# Patient Record
Sex: Female | Born: 1935 | Race: White | Marital: Married | Smoking: Former smoker
Health system: Southern US, Community
[De-identification: ages and names within clinical notes are randomized; demographics above are authoritative.]

## PROBLEM LIST (undated history)

## (undated) DIAGNOSIS — Z741 Need for assistance with personal care: Secondary | ICD-10-CM

## (undated) DIAGNOSIS — I499 Cardiac arrhythmia, unspecified: Secondary | ICD-10-CM

## (undated) DIAGNOSIS — L89152 Pressure ulcer of sacral region, stage 2: Secondary | ICD-10-CM

## (undated) DIAGNOSIS — G4700 Insomnia, unspecified: Secondary | ICD-10-CM

## (undated) DIAGNOSIS — S82402D Unspecified fracture of shaft of left fibula, subsequent encounter for closed fracture with routine healing: Secondary | ICD-10-CM

## (undated) DIAGNOSIS — I25118 Atherosclerotic heart disease of native coronary artery with other forms of angina pectoris: Secondary | ICD-10-CM

## (undated) DIAGNOSIS — E039 Hypothyroidism, unspecified: Secondary | ICD-10-CM

## (undated) DIAGNOSIS — D649 Anemia, unspecified: Secondary | ICD-10-CM

## (undated) DIAGNOSIS — E1122 Type 2 diabetes mellitus with diabetic chronic kidney disease: Secondary | ICD-10-CM

## (undated) DIAGNOSIS — Z9181 History of falling: Secondary | ICD-10-CM

## (undated) DIAGNOSIS — I130 Hypertensive heart and chronic kidney disease with heart failure and stage 1 through stage 4 chronic kidney disease, or unspecified chronic kidney disease: Secondary | ICD-10-CM

## (undated) DIAGNOSIS — I10 Essential (primary) hypertension: Secondary | ICD-10-CM

## (undated) DIAGNOSIS — M6281 Muscle weakness (generalized): Secondary | ICD-10-CM

## (undated) DIAGNOSIS — E559 Vitamin D deficiency, unspecified: Secondary | ICD-10-CM

## (undated) DIAGNOSIS — R278 Other lack of coordination: Secondary | ICD-10-CM

## (undated) DIAGNOSIS — J309 Allergic rhinitis, unspecified: Secondary | ICD-10-CM

## (undated) DIAGNOSIS — I482 Chronic atrial fibrillation, unspecified: Secondary | ICD-10-CM

## (undated) HISTORY — DX: Type 2 diabetes mellitus with diabetic chronic kidney disease: E11.22

## (undated) HISTORY — DX: Anemia, unspecified: D64.9

## (undated) HISTORY — DX: Unspecified fracture of shaft of left fibula, subsequent encounter for closed fracture with routine healing: S82.402D

## (undated) HISTORY — DX: Muscle weakness (generalized): M62.81

## (undated) HISTORY — DX: Chronic atrial fibrillation, unspecified: I48.20

## (undated) HISTORY — DX: Hypertensive heart and chronic kidney disease with heart failure and stage 1 through stage 4 chronic kidney disease, or unspecified chronic kidney disease: I13.0

## (undated) HISTORY — PX: APPENDECTOMY: SHX54

## (undated) HISTORY — DX: Other lack of coordination: R27.8

## (undated) HISTORY — DX: History of falling: Z91.81

## (undated) HISTORY — DX: Need for assistance with personal care: Z74.1

## (undated) HISTORY — DX: Insomnia, unspecified: G47.00

## (undated) HISTORY — DX: Vitamin D deficiency, unspecified: E55.9

## (undated) HISTORY — DX: Pressure ulcer of sacral region, stage 2: L89.152

## (undated) HISTORY — DX: Atherosclerotic heart disease of native coronary artery with other forms of angina pectoris: I25.118

## (undated) HISTORY — DX: Allergic rhinitis, unspecified: J30.9

---

## 1999-10-14 DIAGNOSIS — E039 Hypothyroidism, unspecified: Secondary | ICD-10-CM | POA: Diagnosis present

## 1999-10-14 DIAGNOSIS — M109 Gout, unspecified: Secondary | ICD-10-CM | POA: Diagnosis present

## 1999-10-14 DIAGNOSIS — I499 Cardiac arrhythmia, unspecified: Secondary | ICD-10-CM | POA: Insufficient documentation

## 2020-04-25 DIAGNOSIS — C541 Malignant neoplasm of endometrium: Secondary | ICD-10-CM | POA: Insufficient documentation

## 2021-10-10 ENCOUNTER — Other Ambulatory Visit: Payer: Self-pay

## 2021-10-10 ENCOUNTER — Ambulatory Visit (INDEPENDENT_AMBULATORY_CARE_PROVIDER_SITE_OTHER): Payer: Medicare Other | Admitting: Podiatry

## 2021-10-10 ENCOUNTER — Ambulatory Visit (INDEPENDENT_AMBULATORY_CARE_PROVIDER_SITE_OTHER): Payer: Medicare Other

## 2021-10-10 ENCOUNTER — Inpatient Hospital Stay: Payer: Medicare Other

## 2021-10-10 ENCOUNTER — Emergency Department: Payer: Medicare Other

## 2021-10-10 ENCOUNTER — Telehealth: Payer: Self-pay | Admitting: Podiatry

## 2021-10-10 ENCOUNTER — Inpatient Hospital Stay
Admission: EM | Admit: 2021-10-10 | Discharge: 2021-10-16 | DRG: 540 | Disposition: A | Discharge status: Home Health | Payer: Medicare Other | Attending: Internal Medicine | Admitting: Internal Medicine

## 2021-10-10 DIAGNOSIS — Z833 Family history of diabetes mellitus: Secondary | ICD-10-CM | POA: Diagnosis not present

## 2021-10-10 DIAGNOSIS — Z923 Personal history of irradiation: Secondary | ICD-10-CM | POA: Diagnosis not present

## 2021-10-10 DIAGNOSIS — R7303 Prediabetes: Secondary | ICD-10-CM | POA: Diagnosis present

## 2021-10-10 DIAGNOSIS — E876 Hypokalemia: Secondary | ICD-10-CM | POA: Diagnosis present

## 2021-10-10 DIAGNOSIS — B9561 Methicillin susceptible Staphylococcus aureus infection as the cause of diseases classified elsewhere: Secondary | ICD-10-CM | POA: Diagnosis present

## 2021-10-10 DIAGNOSIS — I480 Paroxysmal atrial fibrillation: Secondary | ICD-10-CM | POA: Diagnosis present

## 2021-10-10 DIAGNOSIS — L03115 Cellulitis of right lower limb: Secondary | ICD-10-CM

## 2021-10-10 DIAGNOSIS — Z20822 Contact with and (suspected) exposure to covid-19: Secondary | ICD-10-CM | POA: Diagnosis present

## 2021-10-10 DIAGNOSIS — M10471 Other secondary gout, right ankle and foot: Secondary | ICD-10-CM | POA: Diagnosis not present

## 2021-10-10 DIAGNOSIS — N1831 Chronic kidney disease, stage 3a: Secondary | ICD-10-CM | POA: Diagnosis present

## 2021-10-10 DIAGNOSIS — Z66 Do not resuscitate: Secondary | ICD-10-CM | POA: Diagnosis present

## 2021-10-10 DIAGNOSIS — R7881 Bacteremia: Secondary | ICD-10-CM | POA: Diagnosis present

## 2021-10-10 DIAGNOSIS — M109 Gout, unspecified: Secondary | ICD-10-CM | POA: Diagnosis present

## 2021-10-10 DIAGNOSIS — Z87891 Personal history of nicotine dependence: Secondary | ICD-10-CM

## 2021-10-10 DIAGNOSIS — C541 Malignant neoplasm of endometrium: Secondary | ICD-10-CM | POA: Diagnosis present

## 2021-10-10 DIAGNOSIS — D649 Anemia, unspecified: Secondary | ICD-10-CM | POA: Diagnosis present

## 2021-10-10 DIAGNOSIS — Z79890 Hormone replacement therapy (postmenopausal): Secondary | ICD-10-CM

## 2021-10-10 DIAGNOSIS — L02611 Cutaneous abscess of right foot: Secondary | ICD-10-CM | POA: Diagnosis not present

## 2021-10-10 DIAGNOSIS — M7989 Other specified soft tissue disorders: Secondary | ICD-10-CM | POA: Diagnosis not present

## 2021-10-10 DIAGNOSIS — E039 Hypothyroidism, unspecified: Secondary | ICD-10-CM | POA: Diagnosis present

## 2021-10-10 DIAGNOSIS — I130 Hypertensive heart and chronic kidney disease with heart failure and stage 1 through stage 4 chronic kidney disease, or unspecified chronic kidney disease: Secondary | ICD-10-CM | POA: Diagnosis present

## 2021-10-10 DIAGNOSIS — Z79899 Other long term (current) drug therapy: Secondary | ICD-10-CM | POA: Diagnosis not present

## 2021-10-10 DIAGNOSIS — Z8542 Personal history of malignant neoplasm of other parts of uterus: Secondary | ICD-10-CM

## 2021-10-10 DIAGNOSIS — I10 Essential (primary) hypertension: Secondary | ICD-10-CM | POA: Diagnosis present

## 2021-10-10 DIAGNOSIS — R739 Hyperglycemia, unspecified: Secondary | ICD-10-CM | POA: Diagnosis present

## 2021-10-10 DIAGNOSIS — M009 Pyogenic arthritis, unspecified: Secondary | ICD-10-CM | POA: Diagnosis present

## 2021-10-10 DIAGNOSIS — I5032 Chronic diastolic (congestive) heart failure: Secondary | ICD-10-CM | POA: Diagnosis present

## 2021-10-10 DIAGNOSIS — M869 Osteomyelitis, unspecified: Principal | ICD-10-CM | POA: Diagnosis present

## 2021-10-10 DIAGNOSIS — R52 Pain, unspecified: Secondary | ICD-10-CM

## 2021-10-10 HISTORY — DX: Hypothyroidism, unspecified: E03.9

## 2021-10-10 HISTORY — DX: Cardiac arrhythmia, unspecified: I49.9

## 2021-10-10 HISTORY — DX: Essential (primary) hypertension: I10

## 2021-10-10 LAB — COMPREHENSIVE METABOLIC PANEL
ALT: 10 U/L (ref 0–44)
AST: 20 U/L (ref 15–41)
Albumin: 3.1 g/dL — ABNORMAL LOW (ref 3.5–5.0)
Alkaline Phosphatase: 168 U/L — ABNORMAL HIGH (ref 38–126)
Anion gap: 11 (ref 5–15)
BUN: 28 mg/dL — ABNORMAL HIGH (ref 8–23)
CO2: 27 mmol/L (ref 22–32)
Calcium: 8.8 mg/dL — ABNORMAL LOW (ref 8.9–10.3)
Chloride: 98 mmol/L (ref 98–111)
Creatinine, Ser: 1.12 mg/dL — ABNORMAL HIGH (ref 0.44–1.00)
GFR, Estimated: 47 mL/min — ABNORMAL LOW (ref >60)
Glucose, Bld: 218 mg/dL — ABNORMAL HIGH (ref 70–99)
Potassium: 3.5 mmol/L (ref 3.5–5.1)
Sodium: 136 mmol/L (ref 135–145)
Total Bilirubin: 0.9 mg/dL (ref 0.3–1.2)
Total Protein: 7.9 g/dL (ref 6.5–8.1)

## 2021-10-10 LAB — CBC WITH DIFFERENTIAL/PLATELET
Abs Immature Granulocytes: 0.04 10*3/uL (ref 0.00–0.07)
Basophils Absolute: 0 10*3/uL (ref 0.0–0.1)
Basophils Relative: 0 %
Eosinophils Absolute: 0.1 10*3/uL (ref 0.0–0.5)
Eosinophils Relative: 1 %
HCT: 37.6 % (ref 36.0–46.0)
Hemoglobin: 12 g/dL (ref 12.0–15.0)
Immature Granulocytes: 0 %
Lymphocytes Relative: 6 %
Lymphs Abs: 0.7 10*3/uL (ref 0.7–4.0)
MCH: 29.5 pg (ref 26.0–34.0)
MCHC: 31.9 g/dL (ref 30.0–36.0)
MCV: 92.4 fL (ref 80.0–100.0)
Monocytes Absolute: 0.7 10*3/uL (ref 0.1–1.0)
Monocytes Relative: 6 %
Neutro Abs: 9.2 10*3/uL — ABNORMAL HIGH (ref 1.7–7.7)
Neutrophils Relative %: 87 %
Platelets: 253 10*3/uL (ref 150–400)
RBC: 4.07 MIL/uL (ref 3.87–5.11)
RDW: 15.1 % (ref 11.5–15.5)
WBC: 10.8 10*3/uL — ABNORMAL HIGH (ref 4.0–10.5)
nRBC: 0 % (ref 0.0–0.2)

## 2021-10-10 LAB — LACTIC ACID, PLASMA
Lactic Acid, Venous: 2.7 mmol/L (ref 0.5–1.9)
Lactic Acid, Venous: 1.6 mmol/L (ref 0.5–1.9)

## 2021-10-10 MED ORDER — POTASSIUM CHLORIDE 20 MEQ PO PACK
40.0000 meq | PACK | Freq: Once | ORAL | Status: AC
  Administered 2021-10-10: 40 meq via ORAL

## 2021-10-10 MED ORDER — SODIUM CHLORIDE 0.9 % IV BOLUS (SEPSIS)
1000.0000 mL | Freq: Once | INTRAVENOUS | Status: AC
  Administered 2021-10-10: 16:00:00 1000 mL via INTRAVENOUS

## 2021-10-10 MED ORDER — SODIUM CHLORIDE 0.9 % IV SOLN
2.0000 g | INTRAVENOUS | Status: AC
  Administered 2021-10-10 – 2021-10-11 (×2): 2 g via INTRAVENOUS
  Filled 2021-10-10 (×2): qty 20

## 2021-10-10 MED ORDER — LEVOTHYROXINE SODIUM 50 MCG PO TABS
100.0000 ug | ORAL_TABLET | Freq: Every day | ORAL | Status: DC
  Administered 2021-10-11 – 2021-10-16 (×6): 100 ug via ORAL
  Filled 2021-10-10 (×6): qty 2

## 2021-10-10 MED ORDER — SODIUM CHLORIDE 0.9 % IV SOLN: INTRAVENOUS | Status: DC

## 2021-10-10 MED ORDER — VITAMIN D3 25 MCG (1000 UNIT) PO TABS
1000.0000 [IU] | ORAL_TABLET | Freq: Every day | ORAL | Status: DC
  Administered 2021-10-11 – 2021-10-16 (×6): 1000 [IU] via ORAL
  Filled 2021-10-10 (×11): qty 1

## 2021-10-10 MED ORDER — FLUTICASONE PROPIONATE 50 MCG/ACT NA SUSP
1.0000 | Freq: Two times a day (BID) | NASAL | Status: DC
  Administered 2021-10-11 – 2021-10-16 (×10): 1 via NASAL
  Filled 2021-10-10: qty 16

## 2021-10-10 MED ORDER — ONDANSETRON HCL 4 MG/2ML IJ SOLN: 4.0000 mg | Freq: Four times a day (QID) | INTRAMUSCULAR | Status: DC | PRN

## 2021-10-10 MED ORDER — PROPRANOLOL HCL ER 120 MG PO CP24
120.0000 mg | ORAL_CAPSULE | Freq: Every day | ORAL | Status: DC
  Administered 2021-10-11 – 2021-10-14 (×4): 120 mg via ORAL
  Filled 2021-10-10 (×5): qty 1

## 2021-10-10 MED ORDER — ACETAMINOPHEN 325 MG PO TABS: 650.0000 mg | ORAL_TABLET | Freq: Four times a day (QID) | ORAL | Status: DC | PRN

## 2021-10-10 MED ORDER — BUMETANIDE 1 MG PO TABS: 1.0000 mg | ORAL_TABLET | Freq: Every day | ORAL | Status: DC

## 2021-10-10 MED ORDER — MAGNESIUM HYDROXIDE 400 MG/5ML PO SUSP: 30.0000 mL | Freq: Every day | ORAL | Status: DC | PRN

## 2021-10-10 MED ORDER — INDAPAMIDE 2.5 MG PO TABS
1.2500 mg | ORAL_TABLET | Freq: Every day | ORAL | Status: DC
Start: 2021-10-10 — End: 2021-10-10

## 2021-10-10 MED ORDER — ENOXAPARIN SODIUM 40 MG/0.4ML IJ SOSY: 40.0000 mg | PREFILLED_SYRINGE | INTRAMUSCULAR | Status: DC

## 2021-10-10 MED ORDER — TRAZODONE HCL 50 MG PO TABS
25.0000 mg | ORAL_TABLET | Freq: Every evening | ORAL | Status: DC | PRN
  Administered 2021-10-13: 25 mg via ORAL
  Filled 2021-10-10: qty 1

## 2021-10-10 MED ORDER — ONDANSETRON HCL 4 MG PO TABS: 4.0000 mg | ORAL_TABLET | Freq: Four times a day (QID) | ORAL | Status: DC | PRN

## 2021-10-10 MED ORDER — ACETAMINOPHEN 650 MG RE SUPP: 650.0000 mg | Freq: Four times a day (QID) | RECTAL | Status: DC | PRN

## 2021-10-10 MED ORDER — LACTATED RINGERS IV SOLN: INTRAVENOUS | Status: DC

## 2021-10-10 MED ORDER — SODIUM CHLORIDE 0.9 % IV SOLN: 2.0000 g | INTRAVENOUS | Status: DC

## 2021-10-10 MED ORDER — DICLOFENAC SODIUM 1 % EX GEL
1.0000 "application " | Freq: Four times a day (QID) | CUTANEOUS | Status: DC
  Administered 2021-10-11 – 2021-10-16 (×14): 1 via TOPICAL
  Filled 2021-10-10: qty 100

## 2021-10-10 NOTE — Telephone Encounter (Signed)
Spoke with the patient's daughter by telephone, very frustrated that the ER is taking so long they have been there since 12:00 and feels nothing has been done.  Advised to stay the areas if they leave now they will to restart process at another facility, I reviewed the [facility] and she did receive ceftriaxone at 1600, lactic acid improved since initial check.  I discussed this with her as well and that although they have been in the waiting room things are in process to evaluate and treat her.  Apologized for the wait and let her know that there is unfortunately no way to expedite this process from my end.  She appreciated the call and they will stay where they are for now and hopefully will get a room soon

## 2021-10-10 NOTE — ED Notes (Signed)
Pt to MRI

## 2021-10-10 NOTE — ED Notes (Signed)
Pts daughter Latoya Jacobs) whom is present at the bedside with the patient asks to speak with this RN regarding her Mothers care. The pts daughter states that she has been here since noon and a physician hasn't seen here nor has she received any treatment. This RN informs the pts daughter that she has received her first round of antibiotics and an EDP evaluated the patient this evening (~2030 per EDP note). The patients daughter becomes very aggressive and states that none of this occurred and it was explained that her care has been documented in her chart and verified by number providers. The patients daughter states that her mothers rights were violated due to [place] not explaining was being done to her. The patient disagreed with her daughters statement stating that she was told she would be receiving antibiotics as recommended by the [facility]. Pt notified that she had a room assigned and that I would be transporting her to the floor shortly and the family member responded with "that's bull crap." This RN notified the patient to inform me if she needed anything - this RN will continue to monitor. Charge RN notified of the families behavior.

## 2021-10-10 NOTE — ED Notes (Signed)
Pt's pulses checked and strong pedal pulse noted.

## 2021-10-10 NOTE — ED Triage Notes (Signed)
Pt comes with c/o right foot pain. Pt states it is infected. Pt states she is borderline diabetic. Pt states it has been going on for too long.

## 2021-10-10 NOTE — Progress Notes (Signed)
Subjective:  Patient ID: Latoya Jacobs, female    DOB: Aug 27, 1929,  MRN: [id number]  Chief Complaint  Patient presents with   Nail Problem    Nail trim    87 y.o. female presents with the above complaint. Patient presents with foot cellulitis with streaking redness up the leg.  Patient states been going for 2 weeks has progressed to gotten worse.  There is swelling associate with it.  She states that she may have had a little bit of cut or scrape that could have led to the infection.  She states that she is not diabetic.  She wanted to get it evaluated.  She wanted to make sure that she is not on lose her leg.  She denies any other acute complaints.  She has not seen anyone else prior to seeing me.   Review of Systems: Negative except as noted in the HPI. Denies N/V/F/Ch.  No past medical history on file.  Current Outpatient Medications:    acetaminophen (TYLENOL) 325 MG suppository, Place 325 mg rectally every 4 (four) hours as needed., Disp: , Rfl:    indapamide (LOZOL) 1.25 MG tablet, Take 1.25 mg by mouth daily., Disp: , Rfl:    levothyroxine (SYNTHROID) 100 MCG tablet, Take 100 mcg by mouth daily before breakfast., Disp: , Rfl:    Vitamin D, Ergocalciferol, (DRISDOL) 1.25 MG (50000 UNIT) CAPS capsule, Take 50,000 Units by mouth every 7 (seven) days., Disp: , Rfl:   Social History   Tobacco Use  Smoking Status Not on file  Smokeless Tobacco Not on file    Not on File Objective:  There were no vitals filed for this visit. There is no height or weight on file to calculate BMI. Constitutional Well developed. Well nourished.  Vascular Dorsalis pedis pulses faintly palpable bilaterally.  Diminished likely due to edema Posterior tibial pulses faintly palpable bilaterally.  Diminished likely due to edema Capillary refill normal to all digits.  No cyanosis or clubbing noted. Pedal hair growth normal.  Neurologic Normal speech. Oriented to person, place, and  time. Epicritic sensation to light touch grossly present bilaterally.  Dermatologic Right foot cellulitis/redness noted streaking up the leg.  Redness up to the mid leg.  No open wounds or lesion noted.  3+ pitting edema noted to the right lower extremity.  No area of fluctuance noted.  Patient may have a deep abscess unable to appreciate clinically  Orthopedic: Normal joint ROM without pain or crepitus bilaterally. No visible deformities. No bony tenderness.      Radiographs: 3 views of skeletally mature the right foot: No soft tissue or infection noted or soft tissue air.  No osteomyelitis noted.  No foreign body noted.  No other clinical signs of infection noted. Assessment:   1. Cellulitis of right foot   2. Abscess of right foot    Plan:  Patient was evaluated and treated and all questions answered.  Right foot/leg cellulitis with a possible deep abscess with underlying severe edema -All questions and concerns were discussed with the patient in extensive detail. -Given the findings of cellulitis I believe patient will benefit from admission to the hospital for IV antibiotics.  They state understanding will immediately go to the emergency room to be admitted for IV antibiotics -She will benefit from an MRI evaluation of the foot to rule out deep abscess.  Clinically I am not able to appreciate any opening sites. -She may only need IV antibiotics for 48 to 72 hours until the cellulitis  resolves.  If the MRI is negative we will not plan on any surgical intervention. -No local wound care at this time -Weightbearing as tolerated to right lower extremity No follow-ups on file.

## 2021-10-10 NOTE — Consult Note (Signed)
CODE SEPSIS - PHARMACY COMMUNICATION  **Broad Spectrum Antibiotics should be administered within 1 hour of Sepsis diagnosis**  Time Code Sepsis Called/Page Received: 1531  Antibiotics Ordered: ceftriaxone  Time of 1st antibiotic administration: 1557  Additional action taken by pharmacy: none  If necessary, Name of Provider/Nurse Contacted: n/a   Carlos Heber Rodriguez-Guzman PharmD, BCPS 10/10/2021 4:11 PM

## 2021-10-10 NOTE — Sepsis Progress Note (Signed)
Following per sepsis protocol   

## 2021-10-10 NOTE — ED Notes (Signed)
Pt back from MRI 

## 2021-10-10 NOTE — ED Notes (Signed)
Pt taken back for repeat lactic. Pt states she believes her foot is worse. Will inform MD.

## 2021-10-10 NOTE — ED Provider Notes (Signed)
[facility] Emergency Department Provider Note   ____________________________________________   Event Date/Time   First MD Initiated Contact with Patient 10/10/21 2044     (approximate)  I have reviewed the triage vital signs and the nursing notes.   HISTORY  Chief Complaint Foot Pain    HPI TATEM HOLSONBACK is a 87 y.o. female who presents for right foot pain  LOCATION: Right foot and ankle DURATION: 2 weeks prior to arrival TIMING: Worsening since onset SEVERITY: Moderate QUALITY: Swelling, erythema, induration, and pain CONTEXT: Patient states that she may have had a small cut or scrape to the right foot that began becoming red, swelling, and with significant pain and has been worsening over the last 2 weeks.  Patient was seen in the podiatry office today by Dr. Posey Pronto who recommended patient present to the emergency department for IV antibiotics and MRI of the right foot MODIFYING FACTORS: Any movement, palpation, or attempting to ambulate on this foot worsens the pain and is partially relieved at rest and with OTC analgesia ASSOCIATED SYMPTOMS: Chills   Per medical record review, patient has no documented medical/surgical history          History reviewed. No pertinent past medical history.  Patient Active Problem List   Diagnosis Date Noted   Cellulitis of right foot 10/10/2021    History reviewed. No pertinent surgical history.  Prior to Admission medications   Medication Sig Start Date End Date Taking? Authorizing Provider  bumetanide (BUMEX) 1 MG tablet Take 1 mg by mouth daily. 08/09/21  Yes [provider]  cholecalciferol (VITAMIN D) 25 MCG (1000 UNIT) tablet Take 1,000 Units by mouth daily. 08/08/21  Yes [provider]  diclofenac Sodium (VOLTAREN) 1 % GEL Apply 1 application topically 4 (four) times daily. 08/08/21  Yes [provider]  fluticasone (FLONASE) 50 MCG/ACT nasal spray Place 1 spray  into both nostrils 2 (two) times daily. 04/30/21  Yes [provider]  indapamide (LOZOL) 1.25 MG tablet Take 1.25 mg by mouth daily.   Yes [provider]  levothyroxine (SYNTHROID) 100 MCG tablet Take 100 mcg by mouth daily before breakfast.   Yes [provider]  propranolol ER (INDERAL LA) 120 MG 24 hr capsule Take 120 mg by mouth daily. 08/08/21  Yes [provider]  acetaminophen (TYLENOL) 325 MG suppository Place 325 mg rectally every 4 (four) hours as needed.    [provider]  acetaminophen (TYLENOL) 325 MG tablet Take 650 mg by mouth every 4 (four) hours as needed. 08/08/21   [provider]  SYNTHROID 112 MCG tablet Take 112 mcg by mouth daily. Patient not taking: Reported on 10/10/2021 08/08/21   [provider]    Allergies Patient has no known allergies.  No family history on file.  Social History    Review of Systems Constitutional: No fever/chills Eyes: No visual changes. ENT: No sore throat. Cardiovascular: Denies chest pain. Respiratory: Denies shortness of breath. Gastrointestinal: No abdominal pain.  No nausea, no vomiting.  No diarrhea. Genitourinary: Negative for dysuria. Musculoskeletal: Negative for acute arthralgias Skin: Erythema induration and pain to the dorsum of the right foot and toes with streaking up the right lower extremity Neurological: Negative for headaches, weakness/numbness/paresthesias in any extremity Psychiatric: Negative for suicidal ideation/homicidal ideation   ____________________________________________   PHYSICAL EXAM:  VITAL SIGNS: ED Triage Vitals  Enc Vitals Group     BP 10/10/21 1200 (!) 157/46     Pulse Rate 10/10/21  1200 69     Resp 10/10/21 1200 20     Temp 10/10/21 1200 98.3 F (36.8 C)     Temp Source 10/10/21 1200 Oral     SpO2 10/10/21 1200 97 %     Weight --      Height --      Head Circumference --      Peak Flow --      Pain Score 10/10/21  1149 8     Pain Loc --      Pain Edu? --      Excl. in [place]? --    Constitutional: Alert and oriented. Well appearing and in no acute distress. Eyes: Conjunctivae are normal. PERRL. Head: Atraumatic. Nose: No congestion/rhinnorhea. Mouth/Throat: Mucous membranes are moist. Neck: No stridor Cardiovascular: Grossly normal heart sounds.  Good peripheral circulation. Respiratory: Normal respiratory effort.  No retractions. Gastrointestinal: Soft and nontender. No distention. Musculoskeletal: No obvious deformities Neurologic:  Normal speech and language. No gross focal neurologic deficits are appreciated. Skin: Erythema, induration, and tenderness to palpation over the dorsum of the right foot with extension up the right ankle and lower extremity.  Skin is warm and dry. Psychiatric: Mood and affect are normal. Speech and behavior are normal.  ____________________________________________   LABS (all labs ordered are listed, but only abnormal results are displayed)  Labs Reviewed  LACTIC ACID, PLASMA - Abnormal; Notable for the following components:      Result Value   Lactic Acid, Venous 2.7 (*)    All other components within normal limits  COMPREHENSIVE METABOLIC PANEL - Abnormal; Notable for the following components:   Glucose, Bld 218 (*)    BUN 28 (*)    Creatinine, Ser 1.12 (*)    Calcium 8.8 (*)    Albumin 3.1 (*)    Alkaline Phosphatase 168 (*)    GFR, Estimated 47 (*)    All other components within normal limits  CBC WITH DIFFERENTIAL/PLATELET - Abnormal; Notable for the following components:   WBC 10.8 (*)    Neutro Abs 9.2 (*)    All other components within normal limits  CULTURE, BLOOD (ROUTINE X 2)  CULTURE, BLOOD (ROUTINE X 2)  RESP PANEL BY RT-PCR (FLU A&B, COVID) ARPGX2  LACTIC ACID, PLASMA  PROTIME-INR  APTT  URINALYSIS, COMPLETE (UACMP) WITH MICROSCOPIC  BASIC METABOLIC PANEL  CBC   RADIOLOGY  ED MD interpretation: Single view portable chest x-ray shows  cardiomegaly without interstitial edema or pleural effusions.  Low lung volumes with no evidence of acute cardiopulmonary disease  Three-view x-ray of the right foot shows nonspecific forefoot soft tissue swelling and osteopenia without radiographic evidence of osteomyelitis  Official radiology report(s): DG Chest Port 1 View  Result Date: 10/10/2021 CLINICAL DATA:  Foot infection.  Questionable sepsis. EXAM: PORTABLE CHEST 1 VIEW COMPARISON:  None. FINDINGS: Heart is enlarged. Atherosclerotic changes are present at the aortic arch. Lung volumes are low. Mild interstitial coarsening appears chronic. No focal airspace disease present. Degenerative changes are present at the shoulders. IMPRESSION: 1. Cardiomegaly without failure. 2. Low lung volumes. 3. No acute cardiopulmonary disease. Electronically Signed   By: San Morelle M.D.   On: 10/10/2021 16:00   DG Foot Complete Right  Result Date: 10/10/2021 CLINICAL DATA:  Right foot pain with swelling and redness. Question infection. EXAM: RIGHT FOOT COMPLETE - 3+ VIEW COMPARISON:  Right foot radiographs same date at 1119 hours from [facility] [place]. FINDINGS: 1452 hours. The bones appear diffusely demineralized.  There is no evidence of acute fracture or dislocation. There is spurring of the base of the 5th metatarsal and calcaneal tuberosity. No bone destruction identified to suggest osteomyelitis. Diffuse soft tissue swelling, especially dorsally. No evidence of foreign body or soft tissue emphysema. Mild degenerative changes at the 1st MTP joint. IMPRESSION: Nonspecific forefoot soft tissue swelling and osteopenia. No radiographic evidence of osteomyelitis. Electronically Signed   By: Richardean Sale M.D.   On: 10/10/2021 15:27    ____________________________________________   PROCEDURES  Procedure(s) performed (including Critical Care):  Procedures   ____________________________________________   INITIAL IMPRESSION /  ASSESSMENT AND PLAN / ED COURSE  As part of my medical decision making, I reviewed the following data within the electronic medical record, if available:  Nursing notes reviewed and incorporated, Labs reviewed, EKG interpreted, Old chart reviewed, Radiograph reviewed and Notes from prior ED visits reviewed and incorporated      Presentation most consistent with simple cellulitis. Given History, Exam, and Workup I have low suspicion for Necrotizing Fasciitis, Abscess, Osteomyelitis, DVT or other emergent problem as a cause for this presentation.  There is a possibility of deep space infection/abscess and therefore will obtain MRI and begin empiric antibiotics.  Consult: I spoke to Dr. Normand Sloop and internal medicine who agreed to accept this patient onto the internal medicine service for further evaluation and management  Dispo: Admit to medicine       ____________________________________________   FINAL CLINICAL IMPRESSION(S) / ED DIAGNOSES  Final diagnoses:  Cellulitis of right foot     ED Discharge Orders     None        Note:  This document was prepared using Dragon voice recognition software and may include unintentional dictation errors.    Naaman Plummer, MD 10/10/21 2213

## 2021-10-11 ENCOUNTER — Encounter: Payer: Self-pay | Admitting: Internal Medicine

## 2021-10-11 ENCOUNTER — Telehealth: Payer: Self-pay | Admitting: Podiatry

## 2021-10-11 ENCOUNTER — Inpatient Hospital Stay: Payer: Medicare Other

## 2021-10-11 DIAGNOSIS — R7881 Bacteremia: Secondary | ICD-10-CM | POA: Diagnosis not present

## 2021-10-11 DIAGNOSIS — I480 Paroxysmal atrial fibrillation: Secondary | ICD-10-CM | POA: Diagnosis present

## 2021-10-11 DIAGNOSIS — I10 Essential (primary) hypertension: Secondary | ICD-10-CM | POA: Diagnosis present

## 2021-10-11 DIAGNOSIS — B9561 Methicillin susceptible Staphylococcus aureus infection as the cause of diseases classified elsewhere: Secondary | ICD-10-CM | POA: Diagnosis not present

## 2021-10-11 DIAGNOSIS — L03115 Cellulitis of right lower limb: Secondary | ICD-10-CM

## 2021-10-11 DIAGNOSIS — I130 Hypertensive heart and chronic kidney disease with heart failure and stage 1 through stage 4 chronic kidney disease, or unspecified chronic kidney disease: Secondary | ICD-10-CM | POA: Insufficient documentation

## 2021-10-11 LAB — BLOOD CULTURE ID PANEL (REFLEXED) - BCID2

## 2021-10-11 LAB — APTT: aPTT: 38 seconds — ABNORMAL HIGH (ref 24–36)

## 2021-10-11 LAB — HEMOGLOBIN A1C
Hgb A1c MFr Bld: 6.3 % — ABNORMAL HIGH (ref 4.8–5.6)
Mean Plasma Glucose: 134.11 mg/dL

## 2021-10-11 LAB — CBC
HCT: 33.5 % — ABNORMAL LOW (ref 36.0–46.0)
Hemoglobin: 10.7 g/dL — ABNORMAL LOW (ref 12.0–15.0)
MCH: 29.2 pg (ref 26.0–34.0)
MCHC: 31.9 g/dL (ref 30.0–36.0)
MCV: 91.5 fL (ref 80.0–100.0)
Platelets: 245 10*3/uL (ref 150–400)
RBC: 3.66 MIL/uL — ABNORMAL LOW (ref 3.87–5.11)
RDW: 14.9 % (ref 11.5–15.5)
WBC: 9.4 10*3/uL (ref 4.0–10.5)
nRBC: 0 % (ref 0.0–0.2)

## 2021-10-11 LAB — URIC ACID: Uric Acid, Serum: 7.3 mg/dL — ABNORMAL HIGH (ref 2.5–7.1)

## 2021-10-11 LAB — BASIC METABOLIC PANEL
Anion gap: 10 (ref 5–15)
BUN: 28 mg/dL — ABNORMAL HIGH (ref 8–23)
CO2: 28 mmol/L (ref 22–32)
Calcium: 8.7 mg/dL — ABNORMAL LOW (ref 8.9–10.3)
Chloride: 103 mmol/L (ref 98–111)
Creatinine, Ser: 0.94 mg/dL (ref 0.44–1.00)
GFR, Estimated: 58 mL/min — ABNORMAL LOW (ref >60)
Glucose, Bld: 127 mg/dL — ABNORMAL HIGH (ref 70–99)
Potassium: 3.3 mmol/L — ABNORMAL LOW (ref 3.5–5.1)
Sodium: 141 mmol/L (ref 135–145)

## 2021-10-11 LAB — PROTIME-INR
INR: 1.2 (ref 0.8–1.2)
Prothrombin Time: 15.2 seconds (ref 11.4–15.2)

## 2021-10-11 MED ORDER — HYDRALAZINE HCL 10 MG PO TABS
10.0000 mg | ORAL_TABLET | Freq: Four times a day (QID) | ORAL | Status: DC | PRN
  Administered 2021-10-13: 10 mg via ORAL
  Filled 2021-10-11 (×2): qty 1

## 2021-10-11 MED ORDER — POTASSIUM CHLORIDE CRYS ER 20 MEQ PO TBCR
40.0000 meq | EXTENDED_RELEASE_TABLET | Freq: Once | ORAL | Status: AC
  Administered 2021-10-11: 18:00:00 40 meq via ORAL
  Filled 2021-10-11: qty 2

## 2021-10-11 MED ORDER — CEFAZOLIN SODIUM-DEXTROSE 2-4 GM/100ML-% IV SOLN
2.0000 g | Freq: Three times a day (TID) | INTRAVENOUS | Status: DC
  Administered 2021-10-12 – 2021-10-16 (×13): 2 g via INTRAVENOUS
  Filled 2021-10-11 (×10): qty 100

## 2021-10-11 MED ORDER — BUMETANIDE 1 MG PO TABS
1.0000 mg | ORAL_TABLET | Freq: Every day | ORAL | Status: DC
  Administered 2021-10-11 – 2021-10-16 (×6): 1 mg via ORAL
  Filled 2021-10-11 (×6): qty 1

## 2021-10-11 MED ORDER — LACTATED RINGERS IV SOLN: INTRAVENOUS | Status: DC

## 2021-10-11 MED ORDER — CEFAZOLIN SODIUM-DEXTROSE 2-4 GM/100ML-% IV SOLN: 2.0000 g | Freq: Three times a day (TID) | INTRAVENOUS | Status: DC

## 2021-10-11 MED ORDER — ENOXAPARIN SODIUM 40 MG/0.4ML IJ SOSY: 40.0000 mg | PREFILLED_SYRINGE | INTRAMUSCULAR | Status: DC

## 2021-10-11 NOTE — Progress Notes (Signed)
PHARMACY - PHYSICIAN COMMUNICATION CRITICAL VALUE ALERT - BLOOD CULTURE IDENTIFICATION (BCID)  Latoya Jacobs is an 87 y.o. female who presented to [facility] on 10/10/2021 with a chief complaint of cellulitis of rt foot with c/f osteo involvement.  Assessment:  Blood culture BCID 1 of 4 vials showing MSSA (anaero only, no resistances). Pt w/ cellulitis c/f osteo and is covered on Rocephin day 2 of abx appears responsive (WBC 10.8 > 9.4; Afeb, VSS).  Is candidate for de-escalation from Rocephin to Ancef for an MSSA bloodstream infection +/- osteo?  Name of physician (or Provider) Contacted: Notified w/ FYI to Dr. Erlinda Hong, however ID (Dr. Delaine Lame) modified order for de-escalation to Ancef  Current antibiotics: Ceftriaxone IV 2g q24   Changes to prescribed antibiotics recommended:  Ceftriaxone >> Ancef 2g IV q8h  Results for orders placed or performed during the hospital encounter of 10/10/21  Blood Culture ID Panel (Reflexed) (Collected: 10/10/2021 12:04 PM)  Result Value Ref Range   Enterococcus faecalis NOT DETECTED NOT DETECTED   Enterococcus Faecium NOT DETECTED NOT DETECTED   Listeria monocytogenes NOT DETECTED NOT DETECTED   Staphylococcus species DETECTED (A) NOT DETECTED   Staphylococcus aureus (BCID) DETECTED (A) NOT DETECTED   Staphylococcus epidermidis NOT DETECTED NOT DETECTED   Staphylococcus lugdunensis NOT DETECTED NOT DETECTED   Streptococcus species NOT DETECTED NOT DETECTED   Streptococcus agalactiae NOT DETECTED NOT DETECTED   Streptococcus pneumoniae NOT DETECTED NOT DETECTED   Streptococcus pyogenes NOT DETECTED NOT DETECTED   A.calcoaceticus-baumannii NOT DETECTED NOT DETECTED   Bacteroides fragilis NOT DETECTED NOT DETECTED   Enterobacterales NOT DETECTED NOT DETECTED   Enterobacter cloacae complex NOT DETECTED NOT DETECTED   Escherichia coli NOT DETECTED NOT DETECTED   Klebsiella aerogenes NOT DETECTED NOT DETECTED   Klebsiella oxytoca NOT DETECTED NOT  DETECTED   Klebsiella pneumoniae NOT DETECTED NOT DETECTED   Proteus species NOT DETECTED NOT DETECTED   Salmonella species NOT DETECTED NOT DETECTED   Serratia marcescens NOT DETECTED NOT DETECTED   Haemophilus influenzae NOT DETECTED NOT DETECTED   Neisseria meningitidis NOT DETECTED NOT DETECTED   Pseudomonas aeruginosa NOT DETECTED NOT DETECTED   Stenotrophomonas maltophilia NOT DETECTED NOT DETECTED   Candida albicans NOT DETECTED NOT DETECTED   Candida auris NOT DETECTED NOT DETECTED   Candida glabrata NOT DETECTED NOT DETECTED   Candida krusei NOT DETECTED NOT DETECTED   Candida parapsilosis NOT DETECTED NOT DETECTED   Candida tropicalis NOT DETECTED NOT DETECTED   Cryptococcus neoformans/gattii NOT DETECTED NOT DETECTED   Meth resistant mecA/C and MREJ NOT DETECTED NOT DETECTED    [name] 10/11/2021  3:40 PM

## 2021-10-11 NOTE — Consult Note (Signed)
PODIATRY CONSULTATION  NAME Latoya Jacobs MRN [id number] DOB 08/25/1929 DOA 10/10/2021   Reason for consult: Cellulitis RT foot Chief Complaint  Patient presents with   Foot Pain   History of present illness: 87 y.o. female PMHx HTN, AFIB, not diabetic, presenting today with her two daughters for worsening infection with swelling RT foot x 2-4weeks. Patient states that she recalls hitting her toe a few weeks prior and developing a small wound to the dorsal RT great toe. Over the Christmas holiday the redness became increasingly worse and she was seen by podiatry, Dr. Boneta Lucks, 10/10/2021 who recommended hospital admission for further workup and IV abx. Patient admitted. Blood cultures positive for staph aureus currently being treated with Ceftriaxone being changed to Cefazolin as per ID, Dr. Ramon Dredge. Resting comfortably in bed this PM.   Past Medical History:  Diagnosis Date   Dysrhythmia    Hypertension    Hypothyroidism    Past Surgical History:  Procedure Laterality Date   APPENDECTOMY     No Known Allergies  CBC Latest Ref Rng & Units 10/11/2021 10/10/2021  WBC 4.0 - 10.5 K/uL 9.4 10.8(H)  Hemoglobin 12.0 - 15.0 g/dL 10.7(L) 12.0  Hematocrit 36.0 - 46.0 % 33.5(L) 37.6  Platelets 150 - 400 K/uL 245 253    BMP Latest Ref Rng & Units 10/11/2021 10/10/2021  Glucose 70 - 99 mg/dL 127(H) 218(H)  BUN 8 - 23 mg/dL 28(H) 28(H)  Creatinine 0.44 - 1.00 mg/dL 0.94 1.12(H)  Sodium 135 - 145 mmol/L 141 136  Potassium 3.5 - 5.1 mmol/L 3.3(L) 3.5  Chloride 98 - 111 mmol/L 103 98  CO2 22 - 32 mmol/L 28 27  Calcium 8.9 - 10.3 mg/dL 8.7(L) 8.8(L)       Physical Exam: General: The patient is alert and oriented x3 in no acute distress.   Dermatology: The small wound the patient had described that developed when the patient hit her toe a few weeks ago has healed. The small healed area is overlying the IPJ of the hallux and does not communicate with the MRI findings  suspicious for OM to the plantar metatarsal head.  Currently there is no open wound   Vascular: Skin is warm to touch. Erythema and edema noted encompassing the forefoot.   Neurological: Epicritic and protective threshold grossly intact bilaterally.   Musculoskeletal Exam: No structural deformity noted. Associated tenderness to the foot  MR FOOT RT WO CONTRAST 10/10/2021 IMPRESSION: 1. Cortical irregularity plantar aspect distal margin first metatarsal, with underlying abnormal T2 signal. Given clinical history, favor osteomyelitis over occult fracture. 2. Moderate joint effusion of the first metatarsophalangeal joint. 3. Diffuse subcutaneous edema.    ASSESSMENT/PLAN OF CARE Cellulitis RT foot. +blood culture s. Aureus - continue IV abx as per ID recommendations.  - for the moment we will monitor and see if there is improvement over the next 48-72 hours. No intervention or surgery planned for the moment.   2. Osteomyelitis vs. Fracture 1st metatarsal head RT foot - MRI reviewed - patient does provide a history of hitting her toe a few weeks prior which may very likely explain the cortical irregularity as identified on MRI with moderate joint effusion. Cannot rule out traumatic fracture. The wound described by the patient which is now healed was to the dorsal hallux, uncommunicating to the plantar aspect of the metatarsal head. I would suspect MRI findings are due to traumatic fracture over osteomyelitis.  - for now we will observe with IV abx. If  there is no significant improvement we will consider bone biopsy of the 1st metatarsal head which was discussed with the patient and daughters this evening as an option if there is no improvement over the course of the weekend - podiatry will continue to follow    Thank you for the consult.    Edrick Kins, DPM [facility]  Dr. Edrick Kins, DPM    [address],  [place] [address]                Office [phone]  Fax [phone]

## 2021-10-11 NOTE — H&P (Signed)
History and Physical    [name] TZG:[id number] DOB: 11-16-1928 DOA: 10/10/2021  PCP: Pcp, No  Patient coming from: Home.  Chief Complaint: Right lower extremity swelling and erythema.  HPI: Latoya Jacobs is a 87 y.o. female with known history of endometrial carcinoma, paroxysmal atrial fibrillation, hypertension, congestive heart failure, hypothyroidism who is visiting [place] from [place] presently living with her daughter started noticing increasing redness and swelling of the right lower extremity from foot up to the mid calf.  Patient's swelling started off with a small callus-like abnormality in the right great toe which patient tried to remove following which it became more swollen and started having pus coming out of it and gradually the swelling worsened and involve the whole right foot and also the distal half of the right leg.  Patient had gone to podiatrist today Dr. Posey Pronto who advised to come to the ER for IV antibiotics and further work-up.  ED Course: In the ER patient had x-rays and MRIs.  MRI shows features concerning for possible osteomyelitis of the first metatarsal.  Patient has been started on empiric antibiotics.  Labs show hyperglycemia with blood sugar of 218 creatinine 1.1 alkaline phos 168 lactic acid initially elevated at around 2.7 which improved with fluids and it is around 1.6 WBC of 10.8.  Blood cultures obtained.  COVID test is pending.  Review of Systems: As per HPI, rest all negative.   Past Medical History:  Diagnosis Date   Dysrhythmia    Hypertension    Hypothyroidism     Past Surgical History:  Procedure Laterality Date   APPENDECTOMY       reports that she has quit smoking. Her smoking use included cigarettes. She has never used smokeless tobacco. She reports that she does not drink alcohol. No history on file for drug use.  No Known Allergies  Family History  Problem Relation Age of Onset   Diabetes Mellitus II  Father     Prior to Admission medications   Medication Sig Start Date End Date Taking? Authorizing Provider  bumetanide (BUMEX) 1 MG tablet Take 1 mg by mouth daily. 08/09/21  Yes [provider]  cholecalciferol (VITAMIN D) 25 MCG (1000 UNIT) tablet Take 1,000 Units by mouth daily. 08/08/21  Yes [provider]  diclofenac Sodium (VOLTAREN) 1 % GEL Apply 1 application topically 4 (four) times daily. 08/08/21  Yes [provider]  fluticasone (FLONASE) 50 MCG/ACT nasal spray Place 1 spray into both nostrils 2 (two) times daily. 04/30/21  Yes [provider]  indapamide (LOZOL) 1.25 MG tablet Take 1.25 mg by mouth daily.   Yes [provider]  levothyroxine (SYNTHROID) 100 MCG tablet Take 100 mcg by mouth daily before breakfast.   Yes [provider]  propranolol ER (INDERAL LA) 120 MG 24 hr capsule Take 120 mg by mouth daily. 08/08/21  Yes [provider]  acetaminophen (TYLENOL) 325 MG suppository Place 325 mg rectally every 4 (four) hours as needed.    [provider]  acetaminophen (TYLENOL) 325 MG tablet Take 650 mg by mouth every 4 (four) hours as needed. 08/08/21   [provider]  SYNTHROID 112 MCG tablet Take 112 mcg by mouth daily. Patient not taking: Reported on 10/10/2021 08/08/21   [provider]    Physical Exam: Constitutional: Moderately built and nourished. Vitals:   10/10/21 1439 10/10/21 1800 10/10/21 2130 10/11/21 0100  BP: (!) 140/47 (!) 155/59 126/64   Pulse: (!) 50  61 71   Resp: 18 16 18    Temp:   98.4 F (36.9 C)   TempSrc:      SpO2: 100% 99% 100%   Weight:    81.6 kg  Height:    5\' 7"  (1.702 m)   Eyes: Anicteric no pallor. ENMT: No discharge from the ears eyes nose and mouth. Neck: No mass felt.  No neck rigidity. Respiratory: No rhonchi or crepitations. Cardiovascular: S1-S2 heard. Abdomen: Soft nontender bowel sound present. Musculoskeletal: Swelling of the right  lower extremity extending from the foot up to the mid calf. Skin: Erythema of the right foot extending from the right foot up to the mid calf. Neurologic: Alert awake oriented time place and person.  Moves all extremities. Psychiatric: Appears normal.  Normal affect.   Labs on Admission: I have personally reviewed following labs and imaging studies  CBC: Recent Labs  Lab 10/10/21 1204  WBC 10.8*  NEUTROABS 9.2*  HGB 12.0  HCT 37.6  MCV 92.4  PLT 619   Basic Metabolic Panel: Recent Labs  Lab 10/10/21 1204  NA 136  K 3.5  CL 98  CO2 27  GLUCOSE 218*  BUN 28*  CREATININE 1.12*  CALCIUM 8.8*   GFR: Estimated Creatinine Clearance: 36.7 mL/min (A) (by C-G formula based on SCr of 1.12 mg/dL (H)). Liver Function Tests: Recent Labs  Lab 10/10/21 1204  AST 20  ALT 10  ALKPHOS 168*  BILITOT 0.9  PROT 7.9  ALBUMIN 3.1*   No results for input(s): LIPASE, AMYLASE in the last 168 hours. No results for input(s): AMMONIA in the last 168 hours. Coagulation Profile: Recent Labs  Lab 10/11/21 0101  INR 1.2   Cardiac Enzymes: No results for input(s): CKTOTAL, CKMB, CKMBINDEX, TROPONINI in the last 168 hours. BNP (last 3 results) No results for input(s): PROBNP in the last 8760 hours. HbA1C: No results for input(s): HGBA1C in the last 72 hours. CBG: No results for input(s): GLUCAP in the last 168 hours. Lipid Profile: No results for input(s): CHOL, HDL, LDLCALC, TRIG, CHOLHDL, LDLDIRECT in the last 72 hours. Thyroid Function Tests: No results for input(s): TSH, T4TOTAL, FREET4, T3FREE, THYROIDAB in the last 72 hours. Anemia Panel: No results for input(s): VITAMINB12, FOLATE, FERRITIN, TIBC, IRON, RETICCTPCT in the last 72 hours. Urine analysis: No results found for: COLORURINE, APPEARANCEUR, LABSPEC, PHURINE, GLUCOSEU, HGBUR, BILIRUBINUR, KETONESUR, PROTEINUR, UROBILINOGEN, NITRITE, LEUKOCYTESUR Sepsis Labs: @LABRCNTIP (procalcitonin:4,lacticidven:4) )No results found  for this or any previous visit (from the past 240 hour(s)).   Radiological Exams on Admission: MR FOOT RIGHT WO CONTRAST  Result Date: 10/10/2021 CLINICAL DATA:  Right foot pain and swelling, erythema EXAM: MRI OF THE RIGHT FOREFOOT WITHOUT CONTRAST TECHNIQUE: Multiplanar, multisequence MR imaging of the right was performed. No intravenous contrast was administered. COMPARISON:  10/10/2021 FINDINGS: Bones/Joint/Cartilage There is abnormal T2 signal within the distal aspect of the first metatarsal, with cortical irregularity along the plantar aspect of the metatarsal head. Findings are suspicious for osteomyelitis. No other acute or destructive bony lesions. There is prominent joint space narrowing of the first metatarsophalangeal joint, with moderate joint effusion identified. Hammertoe deformities are noted. Ligaments No acute findings. Muscles and Tendons No acute findings. Soft tissues There is diffuse subcutaneous edema throughout the right foot and visualized portions the ankle. No fluid collection or evidence of abscess. IMPRESSION: 1. Cortical irregularity plantar aspect distal margin first metatarsal, with underlying abnormal T2 signal. Given clinical history, favor osteomyelitis over occult fracture. 2. Moderate joint effusion of the  first metatarsophalangeal joint. 3. Diffuse subcutaneous edema. Electronically Signed   By: Randa Ngo M.D.   On: 10/10/2021 22:33   DG Chest Port 1 View  Result Date: 10/10/2021 CLINICAL DATA:  Foot infection.  Questionable sepsis. EXAM: PORTABLE CHEST 1 VIEW COMPARISON:  None. FINDINGS: Heart is enlarged. Atherosclerotic changes are present at the aortic arch. Lung volumes are low. Mild interstitial coarsening appears chronic. No focal airspace disease present. Degenerative changes are present at the shoulders. IMPRESSION: 1. Cardiomegaly without failure. 2. Low lung volumes. 3. No acute cardiopulmonary disease. Electronically Signed   By: San Morelle  M.D.   On: 10/10/2021 16:00   DG Foot Complete Right  Result Date: 10/10/2021 CLINICAL DATA:  Right foot pain with swelling and redness. Question infection. EXAM: RIGHT FOOT COMPLETE - 3+ VIEW COMPARISON:  Right foot radiographs same date at 1119 hours [facility] [place]. FINDINGS: 1452 hours. The bones appear diffusely demineralized. There is no evidence of acute fracture or dislocation. There is spurring of the base of the 5th metatarsal and calcaneal tuberosity. No bone destruction identified to suggest osteomyelitis. Diffuse soft tissue swelling, especially dorsally. No evidence of foreign body or soft tissue emphysema. Mild degenerative changes at the 1st MTP joint. IMPRESSION: Nonspecific forefoot soft tissue swelling and osteopenia. No radiographic evidence of osteomyelitis. Electronically Signed   By: Richardean Sale M.D.   On: 10/10/2021 15:27      Assessment/Plan Principal Problem:   Cellulitis of right foot Active Problems:   Essential hypertension   PAF (paroxysmal atrial fibrillation) (HCC)    Cellulitis of the right lower extremity with possibility of osteomyelitis involving the first metatarsal bone of the right foot for which patient has been empirically started on antibiotics follow cultures.  We will follow podiatry recommendations.  Check ABI and Dopplers. Hyperglycemia with no prior history of diabetes we will check hemoglobin A1c. History of congestive heart failure takes Bumex.  Patient did receive fluids in the ER following which lactic acid did improve.  Presently holding further fluids given history of CHF.  For now with holding Bumex we will closely follow respiratory status. History of hypertension on propanolol and indapamide.  Holding indapamide for now given the elevated lactic acid level.  As needed IV hydralazine has been ordered. History of paroxysmal atrial fibrillation on propanolol.  Not on anticoagulation secondary history of severe bleeding  from endometrial carcinoma. History of endometrial carcinoma being followed at [facility]. Chronic kidney disease stage III creatinine appears to be at baseline when compared to the labs available in [place] in June 2021.  COVID test is pending.  Since patient has severe cellulitis and also possible osteomyelitis will need further management and inpatient status.   DVT prophylaxis: SCDs for now.  If no surgical procedure planned by podiatrist then start Lovenox. Code Status: DNR. Family Communication: Patient's daughter. Disposition Plan: Home. Consults called: None. Admission status: Inpatient.   Rise Patience MD [facility] Pager [phone].  If 7PM-7AM, please contact night-coverage [email or web address] Password [email or web address]  10/11/2021, 3:46 AM

## 2021-10-11 NOTE — Telephone Encounter (Signed)
Daughter called stating Dr Posey Pronto sent her mom to the hospital yesterday. Daughter called asking when MD from [facility] is coming to see the patient. Daughter states her mom cant eat or drink and patient is thirsty and hungry. Daughter was wondering if someone could call her and follow up.

## 2021-10-11 NOTE — Consult Note (Addendum)
NAME: Latoya Jacobs  DOB: 1929-07-08  MRN: [id number]  Date/Time: 10/11/2021 6:16 PM  REQUESTING PROVIDER: Dr.Xu Subjective:  REASON FOR CONSULT: MSSA bacteremia ?Daughters at bed side- h/o from patient and her daughters Latoya Jacobs is a 87 y.o. female with a history of HTN, AFIB, endometrial carcinoma s/p radiation in 2021 presents with swelling, pain rt foot and leg Pt lives in [place] and was visiting daughters- A couple weeks to 4 weeks  ago she picked on a scab on the rt great toe and looks like it got infected, apparently she expressed ome fluid- she then noted swelling, redness  and the past 2 days it got worse and she saw podiatrist on 12/29 who asked her to get admitted for Iv antibiotic She did not have any temp more than 99 In the ED BP 126/64, TEMP 98.4, Pulse 71 and sats 100% WBC 10.8, HB 12, PLT 253, cr 1.12 XRAy foot was normal Blood culture sent and she was started on ceftriaxone for cellulitis MRI showed  Cortical irregularity plantar aspect distal margin first metatarsal, with underlying abnormal T2 signal. Given clinical history, favor osteomyelitis over occult fracture.Moderate joint effusion of the first metatarsophalangeal joint.. Diffuse subcutaneous edema. I am seeing the patient as blood culture positive for staph aureus  Past Medical History:  Diagnosis Date   Dysrhythmia    Hypertension    Hypothyroidism    Endometrial carcinoma Afib Past Surgical History:  Procedure Laterality Date   APPENDECTOMY      Social History   Socioeconomic History   Marital status: Married    Spouse name: Not on file   Number of children: Not on file   Years of education: Not on file   Highest education level: Not on file  Occupational History   Not on file  Tobacco Use   Smoking status: Former    Types: Cigarettes   Smokeless tobacco: Never  Substance and Sexual Activity   Alcohol use: Never   Drug use: Not on file   Sexual activity: Not on file   Other Topics Concern   Not on file  Social History Narrative   Not on file   Social Determinants of Health   Financial Resource Strain: Not on file  Food Insecurity: Not on file  Transportation Needs: Not on file  Physical Activity: Not on file  Stress: Not on file  Social Connections: Not on file  Intimate Partner Violence: Not on file    Family History  Problem Relation Age of Onset   Diabetes Mellitus II Father    No Known Allergies I? Current Facility-Administered Medications  Medication Dose Route Frequency Provider Last Rate Last Admin   acetaminophen (TYLENOL) tablet 650 mg  650 mg Oral Q6H PRN Mansy, Jan A, MD       Or   acetaminophen (TYLENOL) suppository 650 mg  650 mg Rectal Q6H PRN Mansy, Jan A, MD       bumetanide (BUMEX) tablet 1 mg  1 mg Oral Daily Florencia Reasons, MD   1 mg at 10/11/21 1102   [START ON 10/12/2021] ceFAZolin (ANCEF) IVPB 2g/100 mL premix  2 g Intravenous Q8H Tsosie Billing, MD       cholecalciferol (VITAMIN D) tablet 1,000 Units  1,000 Units Oral Daily Mansy, Jan A, MD   1,000 Units at 10/11/21 1058   diclofenac Sodium (VOLTAREN) 1 % topical gel 1 application  1 application Topical QID Mansy, Arvella Merles, MD   1 application at 39/76/73 1805  fluticasone (FLONASE) 50 MCG/ACT nasal spray 1 spray  1 spray Each Nare BID Mansy, Jan A, MD   1 spray at 10/11/21 2831   hydrALAZINE (APRESOLINE) tablet 10 mg  10 mg Oral Q6H PRN Rise Patience, MD       levothyroxine (SYNTHROID) tablet 100 mcg  100 mcg Oral Q0600 Mansy, Jan A, MD   100 mcg at 10/11/21 1057   magnesium hydroxide (MILK OF MAGNESIA) suspension 30 mL  30 mL Oral Daily PRN Mansy, Jan A, MD       ondansetron [facility]) tablet 4 mg  4 mg Oral Q6H PRN Mansy, Jan A, MD       Or   ondansetron [facility]) injection 4 mg  4 mg Intravenous Q6H PRN Mansy, Jan A, MD       propranolol ER (INDERAL LA) 24 hr capsule 120 mg  120 mg Oral Daily Mansy, Jan A, MD   120 mg at 10/11/21 1058   traZODone (DESYREL)  tablet 25 mg  25 mg Oral QHS PRN Mansy, Jan A, MD         Abtx:  Anti-infectives (From admission, onward)    Start     Dose/Rate Route Frequency Ordered Stop   10/12/21 1400  ceFAZolin (ANCEF) IVPB 2g/100 mL premix        2 g 200 mL/hr over 30 Minutes Intravenous Every 8 hours 10/11/21 1534     10/10/21 2200  cefTRIAXone (ROCEPHIN) 2 g in sodium chloride 0.9 % 100 mL IVPB  Status:  Discontinued        2 g 200 mL/hr over 30 Minutes Intravenous Every 24 hours 10/10/21 2157 10/10/21 2355   10/10/21 1545  cefTRIAXone (ROCEPHIN) 2 g in sodium chloride 0.9 % 100 mL IVPB        2 g 200 mL/hr over 30 Minutes Intravenous Every 24 hours 10/10/21 1531 10/11/21 1536       REVIEW OF SYSTEMS:  Const: negative fever, negative chills, negative weight loss Eyes: negative diplopia or visual changes, negative eye pain ENT: negative coryza, negative sore throat Resp: negative cough, hemoptysis, dyspnea Cards: negative for chest pain, palpitations, lower extremity edema GU: negative for frequency, dysuria and hematuria GI: Negative for abdominal pain, diarrhea, bleeding, constipation Skin: negative for rash and pruritus Heme: negative for easy bruising and gum/nose bleeding MS: swelling and pain rt foot Neurolo:negative for headaches, dizziness, vertigo, memory problems  Psych: negative for feelings of anxiety, depression  Endocrine:  thyroid, pre diabetes Allergy/Immunology- negative for any medication or food allergies ? Objective:  VITALS:  BP (!) 141/49 (BP Location: Right Arm)    Pulse (!) 55    Temp 97.9 F (36.6 C)    Resp 18    Ht 5\' 7"  (1.702 m)    Wt 81.6 kg    SpO2 96%    BMI 28.19 kg/m  PHYSICAL EXAM:  General: Alert, cooperative, no distress, appears stated age.  Head: Normocephalic, without obvious abnormality, atraumatic. Eyes: Conjunctivae clear, anicteric sclerae. Pupils are equal ENT Nares normal. No drainage or sinus tenderness. Lips, mucosa, and tongue normal. No  Thrush Neck: Supple, symmetrical, no adenopathy, thyroid: non tender no carotid bruit and no JVD. Back: No CVA tenderness. Lungs: Clear to auscultation bilaterally. No Wheezing or Rhonchi. No rales. Heart: irregular- rate controlled Abdomen: Soft, non-tender,not distended. Bowel sounds normal. No masses Extremities: rt leg/foot swollen- erythematous  No obvious wound or dischare- small scab on the rt great toe- no discharge Skin: No rashes  or lesions. Or bruising Lymph: Cervical, supraclavicular normal. Neurologic: Grossly non-focal Pertinent Labs Lab Results CBC    Component Value Date/Time   WBC 9.4 10/11/2021 0517   RBC 3.66 (L) 10/11/2021 0517   HGB 10.7 (L) 10/11/2021 0517   HCT 33.5 (L) 10/11/2021 0517   PLT 245 10/11/2021 0517   MCV 91.5 10/11/2021 0517   MCH 29.2 10/11/2021 0517   MCHC 31.9 10/11/2021 0517   RDW 14.9 10/11/2021 0517   LYMPHSABS 0.7 10/10/2021 1204   MONOABS 0.7 10/10/2021 1204   EOSABS 0.1 10/10/2021 1204   BASOSABS 0.0 10/10/2021 1204    CMP Latest Ref Rng & Units 10/11/2021 10/10/2021  Glucose 70 - 99 mg/dL 127(H) 218(H)  BUN 8 - 23 mg/dL 28(H) 28(H)  Creatinine 0.44 - 1.00 mg/dL 0.94 1.12(H)  Sodium 135 - 145 mmol/L 141 136  Potassium 3.5 - 5.1 mmol/L 3.3(L) 3.5  Chloride 98 - 111 mmol/L 103 98  CO2 22 - 32 mmol/L 28 27  Calcium 8.9 - 10.3 mg/dL 8.7(L) 8.8(L)  Total Protein 6.5 - 8.1 g/dL - 7.9  Total Bilirubin 0.3 - 1.2 mg/dL - 0.9  Alkaline Phos 38 - 126 U/L - 168(H)  AST 15 - 41 U/L - 20  ALT 0 - 44 U/L - 10      Microbiology: Recent Results (from the past 240 hour(s))  Blood culture (routine x 2)     Status: None (Preliminary result)   Collection Time: 10/10/21 12:04 PM   Specimen: BLOOD  Result Value Ref Range Status   Specimen Description BLOOD LEFT ANTECUBITAL  Final   Special Requests   Final    BOTTLES DRAWN AEROBIC AND ANAEROBIC Blood Culture adequate volume   Culture  Setup Time   Final    GRAM POSITIVE  COCCI ANAEROBIC BOTTLE ONLY Organism ID to follow CRITICAL RESULT CALLED TO, READ BACK BY AND VERIFIED WITH: BRANDON BEERS 10/11/21 1459 MU Performed at [facility], [address] [place], [place] [address]    Culture GRAM POSITIVE COCCI  Final   Report Status PENDING  Incomplete  Blood Culture ID Panel (Reflexed)     Status: Abnormal   Collection Time: 10/10/21 12:04 PM  Result Value Ref Range Status   Enterococcus faecalis NOT DETECTED NOT DETECTED Final   Enterococcus Faecium NOT DETECTED NOT DETECTED Final   Listeria monocytogenes NOT DETECTED NOT DETECTED Final   Staphylococcus species DETECTED (A) NOT DETECTED Final    Comment: CRITICAL RESULT CALLED TO, READ BACK BY AND VERIFIED WITH: BRANDON BEERS 10/11/21 1459 MU    Staphylococcus aureus (BCID) DETECTED (A) NOT DETECTED Final    Comment: CRITICAL RESULT CALLED TO, READ BACK BY AND VERIFIED WITH: BRANDON BEERS 10/11/21 1459 MU    Staphylococcus epidermidis NOT DETECTED NOT DETECTED Final   Staphylococcus lugdunensis NOT DETECTED NOT DETECTED Final   Streptococcus species NOT DETECTED NOT DETECTED Final   Streptococcus agalactiae NOT DETECTED NOT DETECTED Final   Streptococcus pneumoniae NOT DETECTED NOT DETECTED Final   Streptococcus pyogenes NOT DETECTED NOT DETECTED Final   A.calcoaceticus-baumannii NOT DETECTED NOT DETECTED Final   Bacteroides fragilis NOT DETECTED NOT DETECTED Final   Enterobacterales NOT DETECTED NOT DETECTED Final   Enterobacter cloacae complex NOT DETECTED NOT DETECTED Final   Escherichia coli NOT DETECTED NOT DETECTED Final   Klebsiella aerogenes NOT DETECTED NOT DETECTED Final   Klebsiella oxytoca NOT DETECTED NOT DETECTED Final   Klebsiella pneumoniae NOT DETECTED NOT DETECTED Final   Proteus species NOT  DETECTED NOT DETECTED Final   Salmonella species NOT DETECTED NOT DETECTED Final   Serratia marcescens NOT DETECTED NOT DETECTED Final   Haemophilus influenzae NOT DETECTED NOT  DETECTED Final   Neisseria meningitidis NOT DETECTED NOT DETECTED Final   Pseudomonas aeruginosa NOT DETECTED NOT DETECTED Final   Stenotrophomonas maltophilia NOT DETECTED NOT DETECTED Final   Candida albicans NOT DETECTED NOT DETECTED Final   Candida auris NOT DETECTED NOT DETECTED Final   Candida glabrata NOT DETECTED NOT DETECTED Final   Candida krusei NOT DETECTED NOT DETECTED Final   Candida parapsilosis NOT DETECTED NOT DETECTED Final   Candida tropicalis NOT DETECTED NOT DETECTED Final   Cryptococcus neoformans/gattii NOT DETECTED NOT DETECTED Final   Meth resistant mecA/C and MREJ NOT DETECTED NOT DETECTED Final    Comment: Performed at [facility], [address] [place], [place] [address]  Blood culture (routine x 2)     Status: None (Preliminary result)   Collection Time: 10/11/21  1:01 AM   Specimen: BLOOD  Result Value Ref Range Status   Specimen Description BLOOD LEFT HAND  Final   Special Requests   Final    BOTTLES DRAWN AEROBIC AND ANAEROBIC Blood Culture adequate volume   Culture   Final    NO GROWTH < 12 HOURS Performed at [facility], [address] [place], [place] [address]    Report Status PENDING  Incomplete    IMAGING RESULTS:  I have personally reviewed the films ?CXR no infiltrate MRI of the rt foot . Cortical irregularity plantar aspect distal margin first metatarsal, with underlying abnormal T2 signal. Given clinical history, favor osteomyelitis over occult fracture. Moderate joint effusion of the first metatarsophalangeal joint.. Diffuse subcutaneous edema.   Impression/Recommendation ? ?MSSA bacteremia - source is from the rt foot- there is no wound or discharge currently On ceftriaxone- Change to cefazolin Will repeat blood culture Will get 2 d echo  Rt foot cellulitis with severe edema- rt great toe swollen with a small scab- no discharge MRI shows possible osteomyelitis of first metatarsal with effusion of the MTP  joint  ?Afib- rate controlled on Propanolol  Endometrial carcinoma s/p radiation in 2021  Anemia  Hypothyroidism on synthroid  Mildly elevated glucose- has  pre diabetes- not on any meds at home   Hyperuricemia- could she have underlying gout on the rt great toe?  ___________________________________________________ Discussed with patient, and her 2 daughters and requesting provider [facility] covering 12/30 pm thru 10/15/20 7am Call if needed

## 2021-10-11 NOTE — Progress Notes (Signed)
Patient is admitted this am , details please see HPI She is sent from podiatry office to the hospital due to Cellulitis of the right lower extremity with possibility of osteomyelitis involving the first metatarsal bone of the right foot , podiatry following,  She is found to have bacteremia,  abx adjusted per ID recommendation, will follow ID recommendation. Hypokalemia, replace k Her viral signs are stable, currently she does not have acute complaints, family at bedside updated.

## 2021-10-12 ENCOUNTER — Inpatient Hospital Stay: Payer: Medicare Other

## 2021-10-12 DIAGNOSIS — I5032 Chronic diastolic (congestive) heart failure: Secondary | ICD-10-CM | POA: Diagnosis present

## 2021-10-12 DIAGNOSIS — L03115 Cellulitis of right lower limb: Secondary | ICD-10-CM

## 2021-10-12 DIAGNOSIS — E039 Hypothyroidism, unspecified: Secondary | ICD-10-CM | POA: Diagnosis present

## 2021-10-12 DIAGNOSIS — R7881 Bacteremia: Secondary | ICD-10-CM | POA: Diagnosis present

## 2021-10-12 DIAGNOSIS — B9561 Methicillin susceptible Staphylococcus aureus infection as the cause of diseases classified elsewhere: Secondary | ICD-10-CM | POA: Diagnosis not present

## 2021-10-12 DIAGNOSIS — M10471 Other secondary gout, right ankle and foot: Secondary | ICD-10-CM | POA: Diagnosis not present

## 2021-10-12 DIAGNOSIS — C541 Malignant neoplasm of endometrium: Secondary | ICD-10-CM | POA: Diagnosis present

## 2021-10-12 DIAGNOSIS — M109 Gout, unspecified: Secondary | ICD-10-CM | POA: Diagnosis present

## 2021-10-12 DIAGNOSIS — N1831 Chronic kidney disease, stage 3a: Secondary | ICD-10-CM

## 2021-10-12 LAB — BASIC METABOLIC PANEL
Anion gap: 9 (ref 5–15)
BUN: 26 mg/dL — ABNORMAL HIGH (ref 8–23)
CO2: 29 mmol/L (ref 22–32)
Calcium: 9 mg/dL (ref 8.9–10.3)
Chloride: 103 mmol/L (ref 98–111)
Creatinine, Ser: 0.94 mg/dL (ref 0.44–1.00)
GFR, Estimated: 58 mL/min — ABNORMAL LOW (ref >60)
Glucose, Bld: 157 mg/dL — ABNORMAL HIGH (ref 70–99)
Potassium: 4 mmol/L (ref 3.5–5.1)
Sodium: 141 mmol/L (ref 135–145)

## 2021-10-12 LAB — CBC
HCT: 34.2 % — ABNORMAL LOW (ref 36.0–46.0)
Hemoglobin: 10.7 g/dL — ABNORMAL LOW (ref 12.0–15.0)
MCH: 29.3 pg (ref 26.0–34.0)
MCHC: 31.3 g/dL (ref 30.0–36.0)
MCV: 93.7 fL (ref 80.0–100.0)
Platelets: 243 10*3/uL (ref 150–400)
RBC: 3.65 MIL/uL — ABNORMAL LOW (ref 3.87–5.11)
RDW: 15.2 % (ref 11.5–15.5)
WBC: 9.7 10*3/uL (ref 4.0–10.5)
nRBC: 0 % (ref 0.0–0.2)

## 2021-10-12 LAB — MAGNESIUM: Magnesium: 1.9 mg/dL (ref 1.7–2.4)

## 2021-10-12 MED ORDER — COLCHICINE 0.6 MG PO TABS
1.2000 mg | ORAL_TABLET | Freq: Once | ORAL | Status: AC
Start: 2021-10-12 — End: 2021-10-12
  Administered 2021-10-12: 1.2 mg via ORAL
  Filled 2021-10-12: qty 2

## 2021-10-12 MED ORDER — COLCHICINE 0.6 MG PO TABS
0.6000 mg | ORAL_TABLET | Freq: Once | ORAL | Status: AC
Start: 2021-10-12 — End: 2021-10-12
  Administered 2021-10-12: 0.6 mg via ORAL
  Filled 2021-10-12: qty 1

## 2021-10-12 MED ORDER — COLCHICINE 0.6 MG PO TABS
0.6000 mg | ORAL_TABLET | Freq: Every day | ORAL | Status: DC
  Administered 2021-10-13 – 2021-10-16 (×4): 0.6 mg via ORAL
  Filled 2021-10-12 (×4): qty 1

## 2021-10-12 NOTE — TOC CM/SW Note (Signed)
°  Transition of Care [facility]) Screening Note   Patient Details  Name: Latoya Jacobs Date of Birth: 1929-05-02   Transition [facility]) CM/SW Contact:    Magnus Ivan, LCSW Phone Number: 10/12/2021, 10:49 AM    Transition of Care Department [facility]) has reviewed patient and no TOC needs have been identified at this time. We will continue to monitor patient advancement through interdisciplinary progression rounds. If new patient transition needs arise, please place a TOC consult.

## 2021-10-12 NOTE — Assessment & Plan Note (Signed)
Blood pressure relatively stable. Will monitor.

## 2021-10-12 NOTE — Assessment & Plan Note (Signed)
Serum creatinine remained stable.  GFR less than 60. Monitor.

## 2021-10-12 NOTE — Assessment & Plan Note (Signed)
Blood cultures positive for MSSA. Repeat cultures ordered for tomorrow. Echocardiogram ordered. Patient does have a murmur. Does not have any hardware. Source most likely the cellulitis. Appreciate ID assistance.

## 2021-10-12 NOTE — Assessment & Plan Note (Signed)
On Inderal. Not on any anticoagulation due to prior history of bleeding from endometrial carcinoma. Will monitor.

## 2021-10-12 NOTE — Assessment & Plan Note (Signed)
Uric acid level elevated. Does not have any history of gout but appears to have gout. Will continue colchicine. Monitor. Not a good candidate for steroid therapy.

## 2021-10-12 NOTE — Plan of Care (Signed)

## 2021-10-12 NOTE — Progress Notes (Signed)
°  Subjective:  Patient ID: Latoya Jacobs, female    DOB: 1928/10/22,  MRN: [id number]  Seen and evaluated bedside with her daughter, says the redness seems little worse than it did yesterday  Negative for chest pain and shortness of breath Fever: no Night sweats: no Objective:   Vitals:   10/12/21 0427 10/12/21 0833  BP: (!) 153/58 (!) 165/52  Pulse: 67 63  Resp: 18 18  Temp: 98.3 F (36.8 C) 97.8 F (36.6 C)  SpO2: 93% 94%   General AA&O x3. Normal mood and affect.  Vascular Dorsalis pedis and posterior tibial pulses 2/4 bilat. Brisk capillary refill to all digits. Pedal hair present.  Neurologic Epicritic sensation grossly intact.  Dermatologic Right foot edematous and tender and red around the first MTPJ  Orthopedic: MMT 5/5 in dorsiflexion, plantarflexion, inversion, and eversion. Normal joint ROM without pain or crepitus.    Assessment & Plan:  Patient was evaluated and treated and all questions answered.  Cellulitis versus gout of right foot -I reviewed the MRI images and I think the chance of this being osteomyelitis is quite low there is minimal erosion noted.  Additionally these periarticular erosions could be present in the setting of gout as well.  Her uric acid was elevated at 7.3 and this is been going on for several weeks.  This is more clinically consistent with gout to me. -I do think continuing empiric treatment for cellulitis is advisable considering she had positive blood culture on admission.  TEE is scheduled for today -Discussed with Dr. Posey Pronto and I think we should also treat concurrently for gout empirically.  Recommended colchicine 1.2 mg, then 0.6 mg 1 hour later and then 0.6 mg daily for 1 week.  This would be advisable over prednisone in the setting of potential cellulitis or infection -If this does not improve in the next 24 to 48 hours and had in the right direction then I think we can consider joint aspiration and bone biopsy to evaluate for both  osteomyelitis and gout  Criselda Peaches, DPM  Accessible via secure chat for questions or concerns.

## 2021-10-12 NOTE — Assessment & Plan Note (Signed)
Presents with swelling of right lower extremity progressively worsening. Seen by podiatry outpatient. MRI shows evidence of possible osteomyelitis. Blood cultures were positive for MSSA. Started on IV antibiotics. Podiatry evaluated the patient and thinks that patient may not have osteomyelitis and this may be an injury. She may also have a component of gout with uric acid level elevated. Continue with IV antibiotics for now and close observation.

## 2021-10-12 NOTE — Assessment & Plan Note (Signed)
On Bumex at home. Currently continued. Will monitor. Appears to have diastolic dysfunction.

## 2021-10-12 NOTE — Progress Notes (Signed)
°  Progress Note   Patient: Latoya Jacobs YQI:[id number] DOB: 1929/05/07 DOA: 10/10/2021     2 DOS: the patient was seen and examined on 10/12/2021   Brief hospital course: No notes on file  Assessment and Plan * MSSA bacteremia- (present on admission) Blood cultures positive for MSSA. Repeat cultures ordered for tomorrow. Echocardiogram ordered. Patient does have a murmur. Does not have any hardware. Source most likely the cellulitis. Appreciate ID assistance.  Cellulitis of right foot- (present on admission) Presents with swelling of right lower extremity progressively worsening. Seen by podiatry outpatient. MRI shows evidence of possible osteomyelitis. Blood cultures were positive for MSSA. Started on IV antibiotics. Podiatry evaluated the patient and thinks that patient may not have osteomyelitis and this may be an injury. She may also have a component of gout with uric acid level elevated. Continue with IV antibiotics for now and close observation.  Gout- (present on admission) Uric acid level elevated. Does not have any history of gout but appears to have gout. Will continue colchicine. Monitor. Not a good candidate for steroid therapy.   Chronic diastolic CHF (congestive heart failure) ([place])- (present on admission) On Bumex at home. Currently continued. Will monitor. Appears to have diastolic dysfunction.  Chronic kidney disease, stage 3a (HCC) Serum creatinine remained stable.  GFR less than 60. Monitor.  Hypothyroidism- (present on admission) On Synthroid. Will continue.  PAF (paroxysmal atrial fibrillation) ([place])- (present on admission) On Inderal. Not on any anticoagulation due to prior history of bleeding from endometrial carcinoma. Will monitor.   Essential hypertension- (present on admission) Blood pressure relatively stable. Will monitor.  Endometrial carcinoma ([place])- (present on admission) Prior history. Follows up with  [facility]. Monitor.     Subjective: No nausea no vomiting.  No fever no chills.  No chest pain.  No pain in her leg.  Objective Vital signs were reviewed and unremarkable. General: Appear in mild distress, erythematous redness of right leg, no other rash; Oral Mucosa Clear, moist. no Abnormal Neck Mass Or lumps, Conjunctiva normal  Cardiovascular: S1 and S2 Present, no Murmur, Respiratory: good respiratory effort, Bilateral Air entry present and CTA, no Crackles, no wheezes Abdomen: Bowel Sound present, Soft and no tenderness Extremities: Bilateral right more than left pedal edema Neurology: alert and oriented to time, place, and person affect appropriate. no new focal deficit Gait not checked due to patient safety concerns   Data Reviewed: Elevated uric acid level.  Family Communication: Family at bedside.  Disposition: Status is: Inpatient  Remains inpatient appropriate because: Ongoing further work-up for MSSA bacteremia as well as need for IV antibiotic.     Time spent: 35 minutes  Author: Berle Mull 10/12/2021 6:11 PM  For on call review [email or web address].

## 2021-10-12 NOTE — Assessment & Plan Note (Signed)
On Synthroid. Will continue.

## 2021-10-12 NOTE — Assessment & Plan Note (Signed)
Prior history. Follows up with [facility]. Monitor.

## 2021-10-13 DIAGNOSIS — M10471 Other secondary gout, right ankle and foot: Secondary | ICD-10-CM | POA: Diagnosis not present

## 2021-10-13 DIAGNOSIS — B9561 Methicillin susceptible Staphylococcus aureus infection as the cause of diseases classified elsewhere: Secondary | ICD-10-CM | POA: Diagnosis not present

## 2021-10-13 DIAGNOSIS — R7881 Bacteremia: Secondary | ICD-10-CM | POA: Diagnosis not present

## 2021-10-13 DIAGNOSIS — L03115 Cellulitis of right lower limb: Secondary | ICD-10-CM | POA: Diagnosis not present

## 2021-10-13 MED ORDER — ENOXAPARIN SODIUM 40 MG/0.4ML IJ SOSY
40.0000 mg | PREFILLED_SYRINGE | INTRAMUSCULAR | Status: DC
  Administered 2021-10-13 – 2021-10-15 (×3): 40 mg via SUBCUTANEOUS
  Filled 2021-10-13 (×3): qty 0.4

## 2021-10-13 NOTE — Progress Notes (Signed)
PROGRESS NOTE    KATESSA ATTRIDGE  TIR:[id number] DOB: 07-31-1929 DOA: 10/10/2021 PCP: Pcp, No   Brief Narrative: Taken from prior notes. Latoya Jacobs is a 88 y.o. female with known history of endometrial carcinoma, paroxysmal atrial fibrillation, hypertension, congestive heart failure, hypothyroidism who is visiting [place] from [place] presently living with her daughter started noticing increasing redness and swelling of the right lower extremity from foot up to the mid calf.  Patient's swelling started off with a small callus-like abnormality in the right great toe which patient tried to remove following which it became more swollen and started having pus coming out of it and gradually the swelling worsened and involve the whole right foot and also the distal half of the right leg.  Patient had gone to podiatrist today Dr. Posey Pronto who advised to come to the ER for IV antibiotics and further work-up.  MRI shows features concerning for possible osteomyelitis of the first metatarsal.  Patient has been started on empiric antibiotics.  Blood cultures with MSSA bacteremia. Podiatry still debating between osteomyelitis/cellulitis/gout flare.  He also started her on colchicine and if there is no improvement in next 1 to 2 days they might consider bone biopsy. Repeat blood cultures negative in 12 hours.  Echocardiogram pending  Subjective: Patient was seen and examined today.  She thinks that her foot is getting better with some improvement in pain.  Patient was little scared of TEE and only wants TTE, after explaining why we need to rule out any endocarditis, she agrees to perform TEE if necessary.  Assessment & Plan:   Principal Problem:   MSSA bacteremia Active Problems:   Cellulitis of right foot   Essential hypertension   PAF (paroxysmal atrial fibrillation) (HCC)   Gout   Chronic diastolic CHF (congestive heart failure) (HCC)   Hypothyroidism   Endometrial carcinoma  (HCC)   Chronic kidney disease, stage 3a (HCC)  Cellulitis of right foot/MSSA bacteremia.  Most likely secondary to cellulitis.  Podiatry is still not convinced about cellulitis and considering gout flare.  Uric acid levels mildly elevated.  No prior history of gout.  Repeat blood cultures negative in 12 hours. -Continue cefazolin -Pending echocardiogram-might need TEE if negative -Continue with colchicine -ID consult  Concern of osteomyelitis of right great toe.  Podiatry is on board and would like to treat with colchicine to rule out gouty arthritis.  No prior history of gout, mildly elevated uric acid. -Podiatry might consider joint aspiration and bone biopsy if no improvement in next 1 to 2 days.  Chronic HFpEF.  Appears euvolemic, only right lower extremity edema which is secondary to cellulitis. -Continue with home dose of Bumex  CKD stage IIIa.  Creatinine seems stable. -Monitor renal function. -Avoid nephrotoxins  Paroxysmal atrial fibrillation.  POA.  No acute concern Not on any anticoagulation at home due to prior history of bleeding. -Continue home dose of Inderal  Essential hypertension.  Blood pressure mildly elevated. -Continue home Inderal and Bumex  Hypothyroidism. -Continue home dose of Synthroid  Objective: Vitals:   10/12/21 1545 10/12/21 1958 10/13/21 0322 10/13/21 0752  BP: (!) 107/44 (!) 145/46 (!) 140/57 (!) 160/60  Pulse: 69 (!) 52 (!) 53 (!) 59  Resp: 18 15 15 16   Temp: 97.9 F (36.6 C) 97.8 F (36.6 C) 97.8 F (36.6 C) 98.1 F (36.7 C)  TempSrc:      SpO2: 98% 96% 92% 95%  Weight:      Height:  No intake or output data in the 24 hours ending 10/13/21 1550 Filed Weights   10/11/21 0100  Weight: 81.6 kg    Examination:  General exam: Appears calm and comfortable  Respiratory system: Clear to auscultation. Respiratory effort normal. Cardiovascular system: S1 & S2 heard, RRR.  Gastrointestinal system: Soft, nontender, nondistended,  bowel sounds positive. Central nervous system: Alert and oriented. No focal neurological deficits. Extremities: Right lower extremity edema up to below knees, erythema with surrounding edema around right big toe. Psychiatry: Judgement and insight appear normal.    DVT prophylaxis: Lovenox Code Status: DNR Family Communication: Called daughter with no response. Disposition Plan:  Status is: Inpatient  Remains inpatient appropriate because: Severity of illness.  Level of care: Med-Surg  All the records are reviewed and case discussed with Care Management/Social Worker. Management plans discussed with the patient, nursing and they are in agreement.  Consultants:  Podiatry  Procedures:  Antimicrobials:  Cefazolin  Data Reviewed: I have personally reviewed following labs and imaging studies  CBC: Recent Labs  Lab 10/10/21 1204 10/11/21 0517 10/12/21 0419  WBC 10.8* 9.4 9.7  NEUTROABS 9.2*  --   --   HGB 12.0 10.7* 10.7*  HCT 37.6 33.5* 34.2*  MCV 92.4 91.5 93.7  PLT 253 245 811   Basic Metabolic Panel: Recent Labs  Lab 10/10/21 1204 10/11/21 0517 10/12/21 0419  NA 136 141 141  K 3.5 3.3* 4.0  CL 98 103 103  CO2 27 28 29   GLUCOSE 218* 127* 157*  BUN 28* 28* 26*  CREATININE 1.12* 0.94 0.94  CALCIUM 8.8* 8.7* 9.0  MG  --   --  1.9   GFR: Estimated Creatinine Clearance: 43.7 mL/min (by C-G formula based on SCr of 0.94 mg/dL). Liver Function Tests: Recent Labs  Lab 10/10/21 1204  AST 20  ALT 10  ALKPHOS 168*  BILITOT 0.9  PROT 7.9  ALBUMIN 3.1*   No results for input(s): LIPASE, AMYLASE in the last 168 hours. No results for input(s): AMMONIA in the last 168 hours. Coagulation Profile: Recent Labs  Lab 10/11/21 0101  INR 1.2   Cardiac Enzymes: No results for input(s): CKTOTAL, CKMB, CKMBINDEX, TROPONINI in the last 168 hours. BNP (last 3 results) No results for input(s): PROBNP in the last 8760 hours. HbA1C: Recent Labs    10/11/21 0517   HGBA1C 6.3*   CBG: No results for input(s): GLUCAP in the last 168 hours. Lipid Profile: No results for input(s): CHOL, HDL, LDLCALC, TRIG, CHOLHDL, LDLDIRECT in the last 72 hours. Thyroid Function Tests: No results for input(s): TSH, T4TOTAL, FREET4, T3FREE, THYROIDAB in the last 72 hours. Anemia Panel: No results for input(s): VITAMINB12, FOLATE, FERRITIN, TIBC, IRON, RETICCTPCT in the last 72 hours. Sepsis Labs: Recent Labs  Lab 10/10/21 1204 10/10/21 1536  LATICACIDVEN 2.7* 1.6    Recent Results (from the past 240 hour(s))  Blood culture (routine x 2)     Status: Abnormal (Preliminary result)   Collection Time: 10/10/21 12:04 PM   Specimen: BLOOD  Result Value Ref Range Status   Specimen Description   Final    BLOOD LEFT ANTECUBITAL Performed at [facility], [address], [place], [place] [address]    Special Requests   Final    BOTTLES DRAWN AEROBIC AND ANAEROBIC Blood Culture adequate volume Performed at [facility], [address] [place], [place] [address]    Culture  Setup Time   Final    GRAM POSITIVE COCCI ANAEROBIC BOTTLE ONLY  CRITICAL RESULT CALLED TO, READ BACK BY AND VERIFIED WITH: BRANDON BEERS 10/11/21 1459 MU    Culture (A)  Final    STAPHYLOCOCCUS AUREUS SUSCEPTIBILITIES TO FOLLOW REPEATING Performed at [facility], [place] [address] [place], [place] [address]    Report Status PENDING  Incomplete  Blood Culture ID Panel (Reflexed)     Status: Abnormal   Collection Time: 10/10/21 12:04 PM  Result Value Ref Range Status   Enterococcus faecalis NOT DETECTED NOT DETECTED Final   Enterococcus Faecium NOT DETECTED NOT DETECTED Final   Listeria monocytogenes NOT DETECTED NOT DETECTED Final   Staphylococcus species DETECTED (A) NOT DETECTED Final    Comment: CRITICAL RESULT CALLED TO, READ BACK BY AND VERIFIED WITH: BRANDON BEERS 10/11/21 1459 MU    Staphylococcus aureus (BCID) DETECTED (A) NOT DETECTED Final    Comment:  CRITICAL RESULT CALLED TO, READ BACK BY AND VERIFIED WITH: BRANDON BEERS 10/11/21 1459 MU    Staphylococcus epidermidis NOT DETECTED NOT DETECTED Final   Staphylococcus lugdunensis NOT DETECTED NOT DETECTED Final   Streptococcus species NOT DETECTED NOT DETECTED Final   Streptococcus agalactiae NOT DETECTED NOT DETECTED Final   Streptococcus pneumoniae NOT DETECTED NOT DETECTED Final   Streptococcus pyogenes NOT DETECTED NOT DETECTED Final   A.calcoaceticus-baumannii NOT DETECTED NOT DETECTED Final   Bacteroides fragilis NOT DETECTED NOT DETECTED Final   Enterobacterales NOT DETECTED NOT DETECTED Final   Enterobacter cloacae complex NOT DETECTED NOT DETECTED Final   Escherichia coli NOT DETECTED NOT DETECTED Final   Klebsiella aerogenes NOT DETECTED NOT DETECTED Final   Klebsiella oxytoca NOT DETECTED NOT DETECTED Final   Klebsiella pneumoniae NOT DETECTED NOT DETECTED Final   Proteus species NOT DETECTED NOT DETECTED Final   Salmonella species NOT DETECTED NOT DETECTED Final   Serratia marcescens NOT DETECTED NOT DETECTED Final   Haemophilus influenzae NOT DETECTED NOT DETECTED Final   Neisseria meningitidis NOT DETECTED NOT DETECTED Final   Pseudomonas aeruginosa NOT DETECTED NOT DETECTED Final   Stenotrophomonas maltophilia NOT DETECTED NOT DETECTED Final   Candida albicans NOT DETECTED NOT DETECTED Final   Candida auris NOT DETECTED NOT DETECTED Final   Candida glabrata NOT DETECTED NOT DETECTED Final   Candida krusei NOT DETECTED NOT DETECTED Final   Candida parapsilosis NOT DETECTED NOT DETECTED Final   Candida tropicalis NOT DETECTED NOT DETECTED Final   Cryptococcus neoformans/gattii NOT DETECTED NOT DETECTED Final   Meth resistant mecA/C and MREJ NOT DETECTED NOT DETECTED Final    Comment: Performed at [facility], [address] [place], [place] [address]  Blood culture (routine x 2)     Status: None (Preliminary result)   Collection Time: 10/11/21  1:01 AM    Specimen: BLOOD  Result Value Ref Range Status   Specimen Description BLOOD LEFT HAND  Final   Special Requests   Final    BOTTLES DRAWN AEROBIC AND ANAEROBIC Blood Culture adequate volume   Culture   Final    NO GROWTH 2 DAYS Performed at [facility], [address] [place], [place] [address]    Report Status PENDING  Incomplete  CULTURE, BLOOD (ROUTINE X 2) w Reflex to ID Panel     Status: None (Preliminary result)   Collection Time: 10/13/21  4:40 AM   Specimen: BLOOD  Result Value Ref Range Status   Specimen Description BLOOD BLOOD RIGHT HAND  Final   Special Requests   Final    BOTTLES DRAWN AEROBIC AND ANAEROBIC Blood  Culture adequate volume   Culture   Final    NO GROWTH <12 HOURS Performed at [facility], [address] [place], [place] [address]    Report Status PENDING  Incomplete  CULTURE, BLOOD (ROUTINE X 2) w Reflex to ID Panel     Status: None (Preliminary result)   Collection Time: 10/13/21  4:51 AM   Specimen: BLOOD  Result Value Ref Range Status   Specimen Description BLOOD BLOOD LEFT HAND  Final   Special Requests   Final    BOTTLES DRAWN AEROBIC AND ANAEROBIC Blood Culture adequate volume   Culture   Final    NO GROWTH <12 HOURS Performed at [facility], [address], [place], [place] [address]    Report Status PENDING  Incomplete     Radiology Studies: US ARTERIAL ABI (SCREENING LOWER EXTREMITY)  Result Date: 10/13/2021 CLINICAL DATA:  Pain. EXAM: NONINVASIVE PHYSIOLOGIC VASCULAR STUDY OF BILATERAL LOWER EXTREMITIES TECHNIQUE: Evaluation of both lower extremities were performed at rest, including calculation of ankle-brachial indices with single level Doppler, pressure and pulse volume recording. COMPARISON:  None. FINDINGS: Right ABI:  0.85 Left ABI:  0.92 Right Lower Extremity:  Irregular waveforms in the right ankle. Left Lower Extremity:  Primarily monophasic waveforms. IMPRESSION: 1. Resting right ABI is 0.85 and  compatible with mild peripheral arterial disease. 2. Resting left ABI is 0.92 and compatible with borderline peripheral arterial disease. Electronically Signed   By: Markus Daft M.D.   On: 10/13/2021 08:22    Scheduled Meds:  bumetanide  1 mg Oral Daily   cholecalciferol  1,000 Units Oral Daily   colchicine  0.6 mg Oral Daily   diclofenac Sodium  1 application Topical QID   fluticasone  1 spray Each Nare BID   levothyroxine  100 mcg Oral Q0600   propranolol ER  120 mg Oral Daily   Continuous Infusions:   ceFAZolin (ANCEF) IV 2 g (10/13/21 1247)     LOS: 3 days   Time spent: 40 minutes. More than 50% of the time was spent in counseling/coordination of care  Lorella Nimrod, MD [facility]  If 7PM-7AM, please contact night-coverage [email or web address]  10/13/2021, 3:50 PM   This record has been created using Systems analyst. Errors have been sought and corrected,but may not always be located. Such creation errors do not reflect on the standard of care.

## 2021-10-13 NOTE — Plan of Care (Signed)
  Problem: Education: Goal: Knowledge of General Education information will improve Description Including pain rating scale, medication(s)/side effects and non-pharmacologic comfort measures Outcome: Progressing   

## 2021-10-13 NOTE — Progress Notes (Signed)
°  Subjective:  Patient ID: Latoya Jacobs, female    DOB: [birth year],  MRN: [id number]  Seen and evaluated bedside with her other daughter who is here today  Negative for chest pain and shortness of breath Fever: no Night sweats: no Objective:   Vitals:   10/13/21 0322 10/13/21 0752  BP: (!) 140/57 (!) 160/60  Pulse: (!) 53 (!) 59  Resp: 15 16  Temp: 97.8 F (36.6 C) 98.1 F (36.7 C)  SpO2: 92% 95%   General AA&O x3. Normal mood and affect.  Vascular Dorsalis pedis and posterior tibial pulses 2/4 bilat. Brisk capillary refill to all digits. Pedal hair present.  Neurologic Epicritic sensation grossly intact.  Dermatologic Right foot edematous and tender and red around the first MTPJ, imp[roving and more purple today   Orthopedic: MMT 5/5 in dorsiflexion, plantarflexion, inversion, and eversion. Normal joint ROM without pain or crepitus.    Assessment & Plan:  Patient was evaluated and treated and all questions answered.  Cellulitis versus gout of right foot -Continue antibiotics as well as colchicine -Will continue to follow closely -If not resolved by Tuesday then recommend we proceed with joint aspiration and bone biopsy which can be done under minimal sedation and local block in OR -She is willing to do trans thoracic but not trans esophageal echocardiogram, she does not want to undergo general anesthesia  Latoya Jacobs, DPM  Accessible via secure chat for questions or concerns.

## 2021-10-13 NOTE — Progress Notes (Signed)
PHARMACIST - PHYSICIAN COMMUNICATION  CONCERNING:  Pharmacy consulted for Enoxaparin (Lovenox) for DVT Prophylaxis    Filed Weights   10/11/21 0100  Weight: 81.6 kg (180 lb)    Body mass index is 28.19 kg/m.  Estimated Creatinine Clearance: 43.7 mL/min (by C-G formula based on SCr of 0.94 mg/dL).  PLAN: Start enoxaparin 40 mg every 24 hours    Sherilyn Banker, PharmD Clinical Pharmacist  10/13/2021 4:03 PM

## 2021-10-14 ENCOUNTER — Inpatient Hospital Stay
Admit: 2021-10-14 | Discharge: 2021-10-14 | Disposition: A | Discharge status: Home / Self Care | Payer: Medicare Other | Attending: Infectious Diseases | Admitting: Infectious Diseases

## 2021-10-14 DIAGNOSIS — M10471 Other secondary gout, right ankle and foot: Secondary | ICD-10-CM | POA: Diagnosis not present

## 2021-10-14 DIAGNOSIS — B9561 Methicillin susceptible Staphylococcus aureus infection as the cause of diseases classified elsewhere: Secondary | ICD-10-CM | POA: Diagnosis not present

## 2021-10-14 DIAGNOSIS — L03115 Cellulitis of right lower limb: Secondary | ICD-10-CM | POA: Diagnosis not present

## 2021-10-14 DIAGNOSIS — R7881 Bacteremia: Secondary | ICD-10-CM | POA: Diagnosis not present

## 2021-10-14 LAB — ECHOCARDIOGRAM COMPLETE
AR max vel: 1.53 cm2
AV Area VTI: 1.55 cm2
AV Area mean vel: 1.52 cm2
AV Mean grad: 7.7 mmHg
AV Peak grad: 14.4 mmHg
Ao pk vel: 1.9 m/s
Area-P 1/2: 4.49 cm2
Height: 67 in
MV VTI: 2.64 cm2
P 1/2 time: 485 msec
S' Lateral: 2.8 cm
Weight: 2880 oz

## 2021-10-14 LAB — SYNOVIAL CELL COUNT + DIFF, W/ CRYSTALS: Crystals, Fluid: NONE SEEN

## 2021-10-14 LAB — CULTURE, BLOOD (ROUTINE X 2): Special Requests: ADEQUATE

## 2021-10-14 MED ORDER — LIDOCAINE HCL 1 % IJ SOLN
5.0000 mL | Freq: Once | INTRAMUSCULAR | Status: DC
  Filled 2021-10-14: qty 5

## 2021-10-14 NOTE — Plan of Care (Signed)
  Problem: Education: Goal: Knowledge of General Education information will improve Description Including pain rating scale, medication(s)/side effects and non-pharmacologic comfort measures Outcome: Progressing   

## 2021-10-14 NOTE — Progress Notes (Signed)
°  Subjective:  Patient ID: Latoya Jacobs, female    DOB: [birth year],  MRN: [id number]  Seen and evaluated bedside with daughter today. Relates pain and redness have improved.   Negative for chest pain and shortness of breath Fever: no Night sweats: no Objective:   Vitals:   10/14/21 0530 10/14/21 0747  BP: (!) 131/51 (!) 145/59  Pulse: (!) 55 73  Resp: 15 16  Temp: 98.3 F (36.8 C) 98.3 F (36.8 C)  SpO2: 97% 94%   General AA&O x3. Normal mood and affect.  Vascular Dorsalis pedis and posterior tibial pulses 2/4 bilat. Brisk capillary refill to all digits. Pedal hair present.  Neurologic Epicritic sensation grossly intact.  Dermatologic Right foot edematous and tender and red around the first MTPJ, improving  Orthopedic: MMT 5/5 in dorsiflexion, plantarflexion, inversion, and eversion. Normal joint ROM without pain or crepitus.    Assessment & Plan:  Patient was evaluated and treated and all questions answered.  Cellulitis versus gout of right foot -Continue antibiotics as well as colchicine -Patient is improving with pain and redness as pictures shown today.  -Will continue to follow closely -Preformed joint aspiration at bedside today without issue. Daughter requesting aspiration today and patient in agreement.  Procedure below. Will follow labs for results.    Procedure: Aspiration right first metatarsophalangeal joint.  Discussed alternatives, risks, complications and verbal consent was obtained.  Location: Right first metatrsophalangeal joint. Skin Prep: Betadine. Anesthesia: None(neuropathy)  Aspiration: About 3 cc of hemorrhagic joint fluid aspirated. Sent for culture and crystal analysis.  Disposition: Patient tolerated procedure well. Injection site dressed with a band-aid.     Lorenda Peck, MD  Accessible via secure chat for questions or concerns.

## 2021-10-14 NOTE — Progress Notes (Signed)
*  PRELIMINARY RESULTS* Echocardiogram 2D Echocardiogram has been performed.  Latoya Jacobs 10/14/2021, 8:30 AM

## 2021-10-14 NOTE — Plan of Care (Signed)
°  Problem: Education: Goal: Knowledge of General Education information will improve Description: Including pain rating scale, medication(s)/side effects and non-pharmacologic comfort measures 10/14/2021 1658 by Derrek Monaco, RN Outcome: Progressing 10/14/2021 1652 by Derrek Monaco, RN Outcome: Progressing   Problem: Health Behavior/Discharge Planning: Goal: Ability to manage health-related needs will improve 10/14/2021 1658 by Derrek Monaco, RN Outcome: Progressing 10/14/2021 1652 by Derrek Monaco, RN Outcome: Progressing   Problem: Clinical Measurements: Goal: Ability to maintain clinical measurements within normal limits will improve 10/14/2021 1658 by Derrek Monaco, RN Outcome: Progressing 10/14/2021 1652 by Derrek Monaco, RN Outcome: Progressing   Problem: Clinical Measurements: Goal: Will remain free from infection 10/14/2021 1658 by Derrek Monaco, RN Outcome: Progressing 10/14/2021 1652 by Derrek Monaco, RN Outcome: Progressing   Problem: Clinical Measurements: Goal: Diagnostic test results will improve 10/14/2021 1658 by Derrek Monaco, RN Outcome: Progressing 10/14/2021 1652 by Derrek Monaco, RN Outcome: Progressing   Problem: Clinical Measurements: Goal: Respiratory complications will improve 10/14/2021 1658 by Derrek Monaco, RN Outcome: Progressing 10/14/2021 1652 by Derrek Monaco, RN Outcome: Progressing

## 2021-10-14 NOTE — Progress Notes (Signed)
PROGRESS NOTE    Latoya Jacobs  WER:[id number] DOB: February 09, 1929 DOA: 10/10/2021 PCP: Pcp, No   Brief Narrative: Taken from prior notes. Latoya Jacobs is a 88 y.o. female with known history of endometrial carcinoma, paroxysmal atrial fibrillation, hypertension, congestive heart failure, hypothyroidism who is visiting Kenna Gilbert from [place] presently living with her daughter started noticing increasing redness and swelling of the right lower extremity from foot up to the mid calf.  Patient's swelling started off with a small callus-like abnormality in the right great toe which patient tried to remove following which it became more swollen and started having pus coming out of it and gradually the swelling worsened and involve the whole right foot and also the distal half of the right leg.  Patient had gone to podiatrist today Dr. Posey Pronto who advised to come to the ER for IV antibiotics and further work-up.  MRI shows features concerning for possible osteomyelitis of the first metatarsal.  Patient has been started on empiric antibiotics.  Blood cultures with MSSA bacteremia. Podiatry still debating between osteomyelitis/cellulitis/gout flare.  He also started her on colchicine and if there is no improvement in next 1 to 2 days they might consider bone biopsy. Repeat blood cultures negative in 12 hours.  TTE was negative for any vegetation, patient is refusing TEE.  Right big toe joint aspiration was done by podiatry today-aspirate sent for culture and crystal evaluation. Will ask ID tomorrow regarding the duration of antibiotics as patient does not want TEE.  Subjective: Patient was seen and examined today.  Sitting comfortably in chair.  She thinks her foot is improving, she does not want any TEE.  Daughter at bedside.  Assessment & Plan:   Principal Problem:   MSSA bacteremia Active Problems:   Cellulitis of right foot   Essential hypertension   PAF (paroxysmal atrial  fibrillation) (HCC)   Gout   Chronic diastolic CHF (congestive heart failure) (HCC)   Hypothyroidism   Endometrial carcinoma (HCC)   Chronic kidney disease, stage 3a (HCC)  Cellulitis of right foot/MSSA bacteremia.  Most likely secondary to cellulitis.  Podiatry is still not convinced about cellulitis and considering gout flare.  Uric acid levels mildly elevated.  No prior history of gout.  Repeat blood cultures negative in 12 hours. -Continue cefazolin -TTE was negative for any vegetations, refusing TEE -Continue with colchicine -ID consult-to determine the duration of antibiotics.  Concern of osteomyelitis of right great toe.  Podiatry is on board and would like to treat with colchicine to rule out gouty arthritis.  No prior history of gout, mildly elevated uric acid. -Joint aspirate who was done by podiatry today-labs sent for culture and crystal evaluation  Chronic HFpEF.  Appears euvolemic, only right lower extremity edema which is secondary to cellulitis. -Continue with home dose of Bumex  CKD stage IIIa.  Creatinine seems stable. -Monitor renal function. -Avoid nephrotoxins  Paroxysmal atrial fibrillation.  POA.  No acute concern Not on any anticoagulation at home due to prior history of bleeding. -Continue home dose of Inderal  Essential hypertension.  Blood pressure mildly elevated. -Continue home Inderal and Bumex  Hypothyroidism. -Continue home dose of Synthroid  Objective: Vitals:   10/13/21 2004 10/14/21 0530 10/14/21 0747 10/14/21 1525  BP: (!) 151/52 (!) 131/51 (!) 145/59 (!) 144/51  Pulse: (!) 59 (!) 55 73 68  Resp: 16 15 16 17   Temp: 98.4 F (36.9 C) 98.3 F (36.8 C) 98.3 F (36.8 C) 98.5 F (36.9 C)  TempSrc:      SpO2: 95% 97% 94% 95%  Weight:      Height:        Intake/Output Summary (Last 24 hours) at 10/14/2021 1537 Last data filed at 10/14/2021 0530 Gross per 24 hour  Intake --  Output 0 ml  Net 0 ml   Filed Weights   10/11/21 0100   Weight: 81.6 kg    Examination:  General.  Well-developed elderly lady, in no acute distress. Pulmonary.  Lungs clear bilaterally, normal respiratory effort. CV.  Regular rate and rhythm, no JVD, rub or murmur. Abdomen.  Soft, nontender, nondistended, BS positive. CNS.  Alert and oriented .  No focal neurologic deficit. Extremities.  No edema, no cyanosis, pulses intact and symmetrical. Psychiatry.  Judgment and insight appears normal.   DVT prophylaxis: Lovenox Code Status: DNR Family Communication: Discussed with daughter at bedside Disposition Plan:  Status is: Inpatient  Remains inpatient appropriate because: Severity of illness.  Level of care: Med-Surg  All the records are reviewed and case discussed with Care Management/Social Worker. Management plans discussed with the patient, nursing and they are in agreement.  Consultants:  Podiatry  Procedures:  Antimicrobials:  Cefazolin  Data Reviewed: I have personally reviewed following labs and imaging studies  CBC: Recent Labs  Lab 10/10/21 1204 10/11/21 0517 10/12/21 0419  WBC 10.8* 9.4 9.7  NEUTROABS 9.2*  --   --   HGB 12.0 10.7* 10.7*  HCT 37.6 33.5* 34.2*  MCV 92.4 91.5 93.7  PLT 253 245 742    Basic Metabolic Panel: Recent Labs  Lab 10/10/21 1204 10/11/21 0517 10/12/21 0419  NA 136 141 141  K 3.5 3.3* 4.0  CL 98 103 103  CO2 27 28 29   GLUCOSE 218* 127* 157*  BUN 28* 28* 26*  CREATININE 1.12* 0.94 0.94  CALCIUM 8.8* 8.7* 9.0  MG  --   --  1.9    GFR: Estimated Creatinine Clearance: 43.7 mL/min (by C-G formula based on SCr of 0.94 mg/dL). Liver Function Tests: Recent Labs  Lab 10/10/21 1204  AST 20  ALT 10  ALKPHOS 168*  BILITOT 0.9  PROT 7.9  ALBUMIN 3.1*    No results for input(s): LIPASE, AMYLASE in the last 168 hours. No results for input(s): AMMONIA in the last 168 hours. Coagulation Profile: Recent Labs  Lab 10/11/21 0101  INR 1.2    Cardiac Enzymes: No results for  input(s): CKTOTAL, CKMB, CKMBINDEX, TROPONINI in the last 168 hours. BNP (last 3 results) No results for input(s): PROBNP in the last 8760 hours. HbA1C: No results for input(s): HGBA1C in the last 72 hours.  CBG: No results for input(s): GLUCAP in the last 168 hours. Lipid Profile: No results for input(s): CHOL, HDL, LDLCALC, TRIG, CHOLHDL, LDLDIRECT in the last 72 hours. Thyroid Function Tests: No results for input(s): TSH, T4TOTAL, FREET4, T3FREE, THYROIDAB in the last 72 hours. Anemia Panel: No results for input(s): VITAMINB12, FOLATE, FERRITIN, TIBC, IRON, RETICCTPCT in the last 72 hours. Sepsis Labs: Recent Labs  Lab 10/10/21 1204 10/10/21 1536  LATICACIDVEN 2.7* 1.6     Recent Results (from the past 240 hour(s))  Blood culture (routine x 2)     Status: Abnormal   Collection Time: 10/10/21 12:04 PM   Specimen: BLOOD  Result Value Ref Range Status   Specimen Description   Final    BLOOD LEFT ANTECUBITAL Performed at [facility], [address], [place], [place] [address]    Special Requests  Final    BOTTLES DRAWN AEROBIC AND ANAEROBIC Blood Culture adequate volume Performed at [facility], [address] [place], [place] [address]    Culture  Setup Time   Final    GRAM POSITIVE COCCI ANAEROBIC BOTTLE ONLY CRITICAL RESULT CALLED TO, READ BACK BY AND VERIFIED WITH: BRANDON BEERS 10/11/21 1459 MU Performed at [facility], [place] [address] [place], [place] [address]    Culture STAPHYLOCOCCUS AUREUS (A)  Final   Report Status 10/14/2021 FINAL  Final   Organism ID, Bacteria STAPHYLOCOCCUS AUREUS  Final      Susceptibility   Staphylococcus aureus - MIC*    CIPROFLOXACIN >=8 RESISTANT Resistant     ERYTHROMYCIN RESISTANT Resistant     GENTAMICIN <=0.5 SENSITIVE Sensitive     OXACILLIN <=0.25 SENSITIVE Sensitive     TETRACYCLINE <=1 SENSITIVE Sensitive     VANCOMYCIN <=0.5 SENSITIVE Sensitive     TRIMETH/SULFA <=10 SENSITIVE Sensitive      CLINDAMYCIN RESISTANT Resistant     RIFAMPIN <=0.5 SENSITIVE Sensitive     Inducible Clindamycin POSITIVE Resistant     * STAPHYLOCOCCUS AUREUS  Blood Culture ID Panel (Reflexed)     Status: Abnormal   Collection Time: 10/10/21 12:04 PM  Result Value Ref Range Status   Enterococcus faecalis NOT DETECTED NOT DETECTED Final   Enterococcus Faecium NOT DETECTED NOT DETECTED Final   Listeria monocytogenes NOT DETECTED NOT DETECTED Final   Staphylococcus species DETECTED (A) NOT DETECTED Final    Comment: CRITICAL RESULT CALLED TO, READ BACK BY AND VERIFIED WITH: BRANDON BEERS 10/11/21 1459 MU    Staphylococcus aureus (BCID) DETECTED (A) NOT DETECTED Final    Comment: CRITICAL RESULT CALLED TO, READ BACK BY AND VERIFIED WITH: BRANDON BEERS 10/11/21 1459 MU    Staphylococcus epidermidis NOT DETECTED NOT DETECTED Final   Staphylococcus lugdunensis NOT DETECTED NOT DETECTED Final   Streptococcus species NOT DETECTED NOT DETECTED Final   Streptococcus agalactiae NOT DETECTED NOT DETECTED Final   Streptococcus pneumoniae NOT DETECTED NOT DETECTED Final   Streptococcus pyogenes NOT DETECTED NOT DETECTED Final   A.calcoaceticus-baumannii NOT DETECTED NOT DETECTED Final   Bacteroides fragilis NOT DETECTED NOT DETECTED Final   Enterobacterales NOT DETECTED NOT DETECTED Final   Enterobacter cloacae complex NOT DETECTED NOT DETECTED Final   Escherichia coli NOT DETECTED NOT DETECTED Final   Klebsiella aerogenes NOT DETECTED NOT DETECTED Final   Klebsiella oxytoca NOT DETECTED NOT DETECTED Final   Klebsiella pneumoniae NOT DETECTED NOT DETECTED Final   Proteus species NOT DETECTED NOT DETECTED Final   Salmonella species NOT DETECTED NOT DETECTED Final   Serratia marcescens NOT DETECTED NOT DETECTED Final   Haemophilus influenzae NOT DETECTED NOT DETECTED Final   Neisseria meningitidis NOT DETECTED NOT DETECTED Final   Pseudomonas aeruginosa NOT DETECTED NOT DETECTED Final   Stenotrophomonas  maltophilia NOT DETECTED NOT DETECTED Final   Candida albicans NOT DETECTED NOT DETECTED Final   Candida auris NOT DETECTED NOT DETECTED Final   Candida glabrata NOT DETECTED NOT DETECTED Final   Candida krusei NOT DETECTED NOT DETECTED Final   Candida parapsilosis NOT DETECTED NOT DETECTED Final   Candida tropicalis NOT DETECTED NOT DETECTED Final   Cryptococcus neoformans/gattii NOT DETECTED NOT DETECTED Final   Meth resistant mecA/C and MREJ NOT DETECTED NOT DETECTED Final    Comment: Performed at [facility], [address] [place], [place] [address]  Blood culture (routine x 2)     Status: None (Preliminary result)  Collection Time: 10/11/21  1:01 AM   Specimen: BLOOD  Result Value Ref Range Status   Specimen Description BLOOD LEFT HAND  Final   Special Requests   Final    BOTTLES DRAWN AEROBIC AND ANAEROBIC Blood Culture adequate volume   Culture   Final    NO GROWTH 2 DAYS Performed at [facility], [address], [place], [place] [address]    Report Status PENDING  Incomplete  CULTURE, BLOOD (ROUTINE X 2) w Reflex to ID Panel     Status: None (Preliminary result)   Collection Time: 10/13/21  4:40 AM   Specimen: BLOOD  Result Value Ref Range Status   Specimen Description BLOOD BLOOD RIGHT HAND  Final   Special Requests   Final    BOTTLES DRAWN AEROBIC AND ANAEROBIC Blood Culture adequate volume   Culture   Final    NO GROWTH <12 HOURS Performed at [facility], [address], [place], [place] [address]    Report Status PENDING  Incomplete  CULTURE, BLOOD (ROUTINE X 2) w Reflex to ID Panel     Status: None (Preliminary result)   Collection Time: 10/13/21  4:51 AM   Specimen: BLOOD  Result Value Ref Range Status   Specimen Description BLOOD BLOOD LEFT HAND  Final   Special Requests   Final    BOTTLES DRAWN AEROBIC AND ANAEROBIC Blood Culture adequate volume   Culture   Final    NO GROWTH <12 HOURS Performed at [facility],  [address] [place], [place] [address]    Report Status PENDING  Incomplete      Radiology Studies: US ARTERIAL ABI (SCREENING LOWER EXTREMITY)  Result Date: 10/13/2021 CLINICAL DATA:  Pain. EXAM: NONINVASIVE PHYSIOLOGIC VASCULAR STUDY OF BILATERAL LOWER EXTREMITIES TECHNIQUE: Evaluation of both lower extremities were performed at rest, including calculation of ankle-brachial indices with single level Doppler, pressure and pulse volume recording. COMPARISON:  None. FINDINGS: Right ABI:  0.85 Left ABI:  0.92 Right Lower Extremity:  Irregular waveforms in the right ankle. Left Lower Extremity:  Primarily monophasic waveforms. IMPRESSION: 1. Resting right ABI is 0.85 and compatible with mild peripheral arterial disease. 2. Resting left ABI is 0.92 and compatible with borderline peripheral arterial disease. Electronically Signed   By: Markus Daft M.D.   On: 10/13/2021 08:22   ECHOCARDIOGRAM COMPLETE  Result Date: 10/14/2021    ECHOCARDIOGRAM REPORT   Patient Name:   MRS. Drue Flirt Dowers Date of Exam: 10/14/2021 Medical Rec #:  [id number]                Height:       67.0 in Accession #:    [id number]               Weight:       180.0 lb Date of Birth:  October 26, 1928                BSA:          1.934 m Patient Age:    47 years                 BP:           145/59 mmHg Patient Gender: F                        HR:           73 bpm. Exam Location:  [facility] Procedure: 2D Echo, Cardiac  Doppler and Color Doppler Indications:     Bacteremia R78.81  History:         Patient has no prior history of Echocardiogram examinations.                  Risk Factors:Hypertension.  Sonographer:     Sherrie Sport Referring Phys:  [id number] Tsosie Billing Diagnosing Phys: [place]  1. Left ventricular ejection fraction, by estimation, is 60 to 65%. The left ventricle has normal function. The left ventricle has no regional wall motion abnormalities. There is mild left ventricular hypertrophy. Left ventricular  diastolic parameters are indeterminate.  2. Right ventricular systolic function was not well visualized. The right ventricular size is not well visualized. There is moderately elevated pulmonary artery systolic pressure.  3. Left atrial size was mildly dilated.  4. Right atrial size was mildly dilated.  5. The mitral valve is degenerative. Mild mitral valve regurgitation. No evidence of mitral stenosis.  6. Tricuspid valve regurgitation is moderate.  7. The aortic valve is grossly normal. Aortic valve regurgitation is mild to moderate. Aortic valve sclerosis is present, with no evidence of aortic valve stenosis. Conclusion(s)/Recommendation(s): No evidence of valvular vegetations on this transthoracic echocardiogram. Consider a transesophageal echocardiogram to exclude infective endocarditis if clinically indicated. FINDINGS  Left Ventricle: Left ventricular ejection fraction, by estimation, is 60 to 65%. The left ventricle has normal function. The left ventricle has no regional wall motion abnormalities. The left ventricular internal cavity size was normal in size. There is  mild left ventricular hypertrophy. Left ventricular diastolic parameters are indeterminate. Right Ventricle: The right ventricular size is not well visualized. Right vetricular wall thickness was not well visualized. Right ventricular systolic function was not well visualized. There is moderately elevated pulmonary artery systolic pressure. Left Atrium: Left atrial size was mildly dilated. Right Atrium: Right atrial size was mildly dilated. Pericardium: There is no evidence of pericardial effusion. Mitral Valve: The mitral valve is degenerative in appearance. Mild mitral valve regurgitation. No evidence of mitral valve stenosis. MV peak gradient, 9.7 mmHg. The mean mitral valve gradient is 3.0 mmHg. Tricuspid Valve: The tricuspid valve is not well visualized. Tricuspid valve regurgitation is moderate . No evidence of tricuspid stenosis. Aortic  Valve: The aortic valve is grossly normal. Aortic valve regurgitation is mild to moderate. Aortic regurgitation PHT measures 485 msec. Aortic valve sclerosis is present, with no evidence of aortic valve stenosis. Aortic valve mean gradient measures 7.7 mmHg. Aortic valve peak gradient measures 14.4 mmHg. Aortic valve area, by VTI measures 1.55 cm. Pulmonic Valve: The pulmonic valve was not well visualized. Pulmonic valve regurgitation is not visualized. No evidence of pulmonic stenosis. Aorta: The aortic root is normal in size and structure. Venous: The inferior vena cava was not well visualized. IAS/Shunts: No atrial level shunt detected by color flow Doppler.  LEFT VENTRICLE PLAX 2D LVIDd:         4.30 cm LVIDs:         2.80 cm LV PW:         1.30 cm LV IVS:        1.00 cm LVOT diam:     2.00 cm LV SV:         67 LV SV Index:   35 LVOT Area:     3.14 cm  RIGHT VENTRICLE RV Basal diam:  3.80 cm RV S prime:     8.49 cm/s TAPSE (M-mode): 3.6 cm LEFT ATRIUM  Index        RIGHT ATRIUM           Index LA diam:      5.10 cm 2.64 cm/m   RA Area:     24.10 cm LA Vol (A2C): 70.6 ml 36.51 ml/m  RA Volume:   73.10 ml  37.80 ml/m LA Vol (A4C): 72.3 ml 37.39 ml/m  AORTIC VALVE                     PULMONIC VALVE AV Area (Vmax):    1.53 cm      PV Vmax:        0.60 m/s AV Area (Vmean):   1.52 cm      PV Vmean:       43.900 cm/s AV Area (VTI):     1.55 cm      PV VTI:         0.104 m AV Vmax:           190.00 cm/s   PV Peak grad:   1.4 mmHg AV Vmean:          129.667 cm/s  PV Mean grad:   1.0 mmHg AV VTI:            0.433 m       RVOT Peak grad: 1 mmHg AV Peak Grad:      14.4 mmHg AV Mean Grad:      7.7 mmHg LVOT Vmax:         92.70 cm/s LVOT Vmean:        62.600 cm/s LVOT VTI:          0.214 m LVOT/AV VTI ratio: 0.49 AI PHT:            485 msec  AORTA Ao Root diam: 2.60 cm MITRAL VALVE                TRICUSPID VALVE MV Area (PHT): 4.49 cm     TR Peak grad:   56.2 mmHg MV Area VTI:   2.64 cm     TR Vmax:         375.00 cm/s MV Peak grad:  9.7 mmHg MV Mean grad:  3.0 mmHg     SHUNTS MV Vmax:       1.56 m/s     Systemic VTI:  0.21 m MV Vmean:      80.6 cm/s    Systemic Diam: 2.00 cm MV Decel Time: 169 msec     Pulmonic VTI:  0.090 m MV E velocity: 114.00 cm/s Donnelly Angelica Electronically signed by Donnelly Angelica Signature Date/Time: 10/14/2021/12:48:48 PM    Final     Scheduled Meds:  bumetanide  1 mg Oral Daily   cholecalciferol  1,000 Units Oral Daily   colchicine  0.6 mg Oral Daily   diclofenac Sodium  1 application Topical QID   enoxaparin (LOVENOX) injection  40 mg Subcutaneous Q24H   fluticasone  1 spray Each Nare BID   levothyroxine  100 mcg Oral Q0600   lidocaine  5 mL Intradermal Once   propranolol ER  120 mg Oral Daily   Continuous Infusions:   ceFAZolin (ANCEF) IV 2 g (10/14/21 1240)     LOS: 4 days   Time spent: 39 minutes. More than 50% of the time was spent in counseling/coordination of care  Lorella Nimrod, MD [facility]  If 7PM-7AM, please contact night-coverage [email or web address]  10/14/2021, 3:37 PM   This record  has been created using Systems analyst. Errors have been sought and corrected,but may not always be located. Such creation errors do not reflect on the standard of care.

## 2021-10-15 ENCOUNTER — Inpatient Hospital Stay: Payer: Self-pay

## 2021-10-15 ENCOUNTER — Telehealth: Payer: Self-pay | Admitting: *Deleted

## 2021-10-15 ENCOUNTER — Other Ambulatory Visit: Payer: Self-pay

## 2021-10-15 DIAGNOSIS — L03115 Cellulitis of right lower limb: Secondary | ICD-10-CM | POA: Diagnosis not present

## 2021-10-15 DIAGNOSIS — M10471 Other secondary gout, right ankle and foot: Secondary | ICD-10-CM | POA: Diagnosis not present

## 2021-10-15 DIAGNOSIS — B9561 Methicillin susceptible Staphylococcus aureus infection as the cause of diseases classified elsewhere: Secondary | ICD-10-CM | POA: Diagnosis not present

## 2021-10-15 DIAGNOSIS — R7881 Bacteremia: Secondary | ICD-10-CM | POA: Diagnosis not present

## 2021-10-15 LAB — BASIC METABOLIC PANEL
Anion gap: 10 (ref 5–15)
BUN: 22 mg/dL (ref 8–23)
CO2: 30 mmol/L (ref 22–32)
Calcium: 8.4 mg/dL — ABNORMAL LOW (ref 8.9–10.3)
Chloride: 99 mmol/L (ref 98–111)
Creatinine, Ser: 0.9 mg/dL (ref 0.44–1.00)
GFR, Estimated: >60 mL/min (ref >60)
Glucose, Bld: 121 mg/dL — ABNORMAL HIGH (ref 70–99)
Potassium: 3.3 mmol/L — ABNORMAL LOW (ref 3.5–5.1)
Sodium: 139 mmol/L (ref 135–145)

## 2021-10-15 LAB — TSH: TSH: 3.53 u[IU]/mL (ref 0.350–4.500)

## 2021-10-15 LAB — MAGNESIUM: Magnesium: 1.8 mg/dL (ref 1.7–2.4)

## 2021-10-15 MED ORDER — MAGNESIUM SULFATE 2 GM/50ML IV SOLN
2.0000 g | Freq: Once | INTRAVENOUS | Status: AC
  Administered 2021-10-15: 2 g via INTRAVENOUS
  Filled 2021-10-15: qty 50

## 2021-10-15 MED ORDER — POTASSIUM CHLORIDE CRYS ER 20 MEQ PO TBCR
40.0000 meq | EXTENDED_RELEASE_TABLET | Freq: Once | ORAL | Status: AC
  Administered 2021-10-15: 40 meq via ORAL
  Filled 2021-10-15: qty 2

## 2021-10-15 NOTE — Progress Notes (Signed)
HR dropped to 38 per central monitoring. Did not sustain. Patient alert, no complaints of pain or discomfort. MD notified, new order to obtain EKG. Will continue to monitor to ensure comfort and safety.

## 2021-10-15 NOTE — Care Management Important Message (Signed)
Important Message  Patient Details  Name: Latoya Jacobs MRN: [id number] Date of Birth: 1929-06-21   Medicare Important Message Given:  Yes  I reviewed the Important Message from Medicare with the patient but she said her daughter signs paperwork.  I left a copy for the patient and a copy for the daughter to sign after she reviews it.  I wished her well and thanked her for her time.   [name] [name] 10/15/2021, 12:14 PM

## 2021-10-15 NOTE — Progress Notes (Signed)
PODIATRY PROGRESS NOTE  NAME Latoya Jacobs MRN [id number] DOB March 15, 1929 DOA 10/10/2021   Reason for consult: RT FOOT CELLULITIS Chief Complaint  Patient presents with   Foot Pain   History of present illness: 88 y.o. female PMHx A. fib, HTN, CHF originally admitted for cellulitis of the right lower extremity encompassing the foot and through the distal half of the right leg.  She was originally seen outpatient in the podiatry office 10/10/2021 which time she was advised to go to the emergency department for IV antibiotics and admission.  The redness and swelling of the right lower extremity has improved since admission.  Patient is not diabetic.  Blood cultures positive for staph aureus.  MRI concerning for possible osteomyelitis to the plantar aspect of the first metatarsal head, although there is no open wound to this area.  There was concern also for possible acute gout.  Uric acid 10/11/2021 7.3.  Current differential diagnosis infectious process vs. gout. Joint aspiration performed yesterday, 10/14/2021 pending.    Past Medical History:  Diagnosis Date   Dysrhythmia    Hypertension    Hypothyroidism     CBC Latest Ref Rng & Units 10/12/2021 10/11/2021 10/10/2021  WBC 4.0 - 10.5 K/uL 9.7 9.4 10.8(H)  Hemoglobin 12.0 - 15.0 g/dL 10.7(L) 10.7(L) 12.0  Hematocrit 36.0 - 46.0 % 34.2(L) 33.5(L) 37.6  Platelets 150 - 400 K/uL 243 245 253    BMP Latest Ref Rng & Units 10/15/2021 10/12/2021 10/11/2021  Glucose 70 - 99 mg/dL 121(H) 157(H) 127(H)  BUN 8 - 23 mg/dL 22 26(H) 28(H)  Creatinine 0.44 - 1.00 mg/dL 0.90 0.94 0.94  Sodium 135 - 145 mmol/L 139 141 141  Potassium 3.5 - 5.1 mmol/L 3.3(L) 4.0 3.3(L)  Chloride 98 - 111 mmol/L 99 103 103  CO2 22 - 32 mmol/L 30 29 28   Calcium 8.9 - 10.3 mg/dL 8.4(L) 9.0 8.7(L)       Physical Exam: General: The patient is alert and oriented x3 in no acute distress.   Dermatology: Skin is warm, dry and supple bilateral lower extremities.  Negative for open lesions or macerations.  The superficial wound to the dorsal IPJ that was discussed by the patient has healed completely.  There is no open wound to the foot  Vascular: Edema with erythema noted to the foot   Neurological: Epicritic and protective threshold grossly intact bilaterally.   Musculoskeletal Exam: No structural deformity noted.    ASSESSMENT/PLAN OF CARE Patient Active Problem List   Diagnosis Date Noted   Chronic diastolic CHF (congestive heart failure) ([place]) 10/12/2021   Hypothyroidism 10/12/2021   Endometrial carcinoma ([place]) 10/12/2021   MSSA bacteremia 10/12/2021   Chronic kidney disease, stage 3a ([place]) 10/12/2021   Gout    Essential hypertension 10/11/2021   PAF (paroxysmal atrial fibrillation) ([place]) 10/11/2021   Cellulitis of right foot 10/10/2021   Cellulitis RT foot/MSSA bacteremia -PICC line vs. oral antibiotics post discharge.  Patient is refusing the idea of PICC line this evening.  Infectious disease on board and their recommendations are greatly appreciated  Possible gout RT foot -No crystals seen with joint aspirate obtained yesterday, 10/14/2021 -Currently on 0.6 mg colchicine daily   Will follow.   Edrick Kins, DPM [facility]  Dr. Edrick Kins, DPM    [address]  [place], [place] [address]                Office [phone]  Fax [phone]

## 2021-10-15 NOTE — Progress Notes (Addendum)
PICC consult: Arrived to room, RNs doing bedside report. Informed this RN that patient is not interested in PICC. Helene Kelp, RN reports Infectious Disease MD is aware and will address with patient on 10/16/21.

## 2021-10-15 NOTE — Progress Notes (Addendum)
PROGRESS NOTE    Latoya Jacobs  BHA:[id number] DOB: June 27, 1929 DOA: 10/10/2021 PCP: Pcp, No   Brief Narrative: Taken from prior notes. Latoya Jacobs is a 88 y.o. female with known history of endometrial carcinoma, paroxysmal atrial fibrillation, hypertension, congestive heart failure, hypothyroidism who is visiting Latoya Jacobs from [place] presently living with her daughter started noticing increasing redness and swelling of the right lower extremity from foot up to the mid calf.  Patient's swelling started off with a small callus-like abnormality in the right great toe which patient tried to remove following which it became more swollen and started having pus coming out of it and gradually the swelling worsened and involve the whole right foot and also the distal half of the right leg.  Patient had gone to podiatrist today Dr. Posey Pronto who advised to come to the ER for IV antibiotics and further work-up.  MRI shows features concerning for possible osteomyelitis of the first metatarsal.  Patient has been started on empiric antibiotics.  Blood cultures with MSSA bacteremia. Podiatry still debating between osteomyelitis/cellulitis/gout flare.  He also started her on colchicine and if there is no improvement in next 1 to 2 days they might consider bone biopsy. Repeat blood cultures negative in 12 hours.  TTE was negative for any vegetation, patient is refusing TEE.  Right big toe joint aspiration was negative for crystals, no organisms seen on culture yet. Message sent to ID, Dr. Levester Fresh to determine the type and duration of antibiotic for discharge.  ID is recommending 4 weeks of cefazolin-ordered PICC line Home health services for PT and RN was also ordered  Subjective: Patient was sitting comfortably in the chair when seen today.  Daughter at bedside.  She thinks that her foot is improving.  Still does not want TEE.  Assessment & Plan:   Principal Problem:   MSSA  bacteremia Active Problems:   Cellulitis of right foot   Essential hypertension   PAF (paroxysmal atrial fibrillation) (HCC)   Gout   Chronic diastolic CHF (congestive heart failure) (HCC)   Hypothyroidism   Endometrial carcinoma (HCC)   Chronic kidney disease, stage 3a (HCC)  Cellulitis of right foot/MSSA bacteremia.  Most likely secondary to cellulitis.  Podiatry is still not convinced about cellulitis and considering gout flare.  Uric acid levels mildly elevated.  No prior history of gout.  Repeat blood cultures negative so far. -Continue cefazolin -TTE was negative for any vegetations, refusing TEE -Continue with colchicine -ID consult-to determine the duration of antibiotics.  Concern of osteomyelitis of right great toe.  Podiatry is on board and would like to treat with colchicine to rule out gouty arthritis.  No prior history of gout, mildly elevated uric acid. -Joint aspirate was negative for crystals.  Chronic HFpEF.  Appears euvolemic, only right lower extremity edema which is secondary to cellulitis and now improving. -Continue with home dose of Bumex  CKD stage IIIa.  Creatinine seems stable. -Monitor renal function. -Avoid nephrotoxins  Hypokalemia.  Potassium at 3.3 -Replete potassium as needed and monitor  Paroxysmal atrial fibrillation.  POA.  No acute concern Not on any anticoagulation at home due to prior history of bleeding. -Continue home dose of Inderal-holding for today as she was little bradycardic earlier during sleep.  Essential hypertension.  Blood pressure mildly elevated. -Continue home Inderal and Bumex -Inderal is being held for today  Hypothyroidism. -Continue home dose of Synthroid  Objective: Vitals:   10/15/21 0503 10/15/21 0730 10/15/21 1124 10/15/21 1521  BP: (!) 148/46 (!) 153/54 (!) 154/82 (!) 158/42  Pulse: (!) 52 72 81 68  Resp: 17 17 17 17   Temp: 98 F (36.7 C) 97.6 F (36.4 C) 97.7 F (36.5 C) 97.8 F (36.6 C)  TempSrc:       SpO2: 95% 96% 96% 98%  Weight:      Height:        Intake/Output Summary (Last 24 hours) at 10/15/2021 1526 Last data filed at 10/15/2021 1300 Gross per 24 hour  Intake 880 ml  Output --  Net 880 ml    Filed Weights   10/11/21 0100  Weight: 81.6 kg    Examination:  General.  Well-developed lady, in no acute distress. Pulmonary.  Lungs clear bilaterally, normal respiratory effort. CV.  Regular rate and rhythm, no JVD, rub or murmur. Abdomen.  Soft, nontender, nondistended, BS positive. CNS.  Alert and oriented .  No focal neurologic deficit. Extremities.  No edema, no cyanosis, pulses intact and symmetrical, mild erythema with surrounding edema around right big toe-much improved than before Psychiatry.  Judgment and insight appears normal.   DVT prophylaxis: Lovenox Code Status: DNR Family Communication: Discussed with daughter at bedside Disposition Plan:  Status is: Inpatient  Remains inpatient appropriate because: Severity of illness.  Level of care: Med-Surg  All the records are reviewed and case discussed with Care Management/Social Worker. Management plans discussed with the patient, nursing and they are in agreement.  Consultants:  Podiatry  Procedures:  Antimicrobials:  Cefazolin  Data Reviewed: I have personally reviewed following labs and imaging studies  CBC: Recent Labs  Lab 10/10/21 1204 10/11/21 0517 10/12/21 0419  WBC 10.8* 9.4 9.7  NEUTROABS 9.2*  --   --   HGB 12.0 10.7* 10.7*  HCT 37.6 33.5* 34.2*  MCV 92.4 91.5 93.7  PLT 253 245 681    Basic Metabolic Panel: Recent Labs  Lab 10/10/21 1204 10/11/21 0517 10/12/21 0419 10/15/21 0804  NA 136 141 141 139  K 3.5 3.3* 4.0 3.3*  CL 98 103 103 99  CO2 27 28 29 30   GLUCOSE 218* 127* 157* 121*  BUN 28* 28* 26* 22  CREATININE 1.12* 0.94 0.94 0.90  CALCIUM 8.8* 8.7* 9.0 8.4*  MG  --   --  1.9 1.8    GFR: Estimated Creatinine Clearance: 45.6 mL/min (by C-G formula based on SCr of  0.9 mg/dL). Liver Function Tests: Recent Labs  Lab 10/10/21 1204  AST 20  ALT 10  ALKPHOS 168*  BILITOT 0.9  PROT 7.9  ALBUMIN 3.1*    No results for input(s): LIPASE, AMYLASE in the last 168 hours. No results for input(s): AMMONIA in the last 168 hours. Coagulation Profile: Recent Labs  Lab 10/11/21 0101  INR 1.2    Cardiac Enzymes: No results for input(s): CKTOTAL, CKMB, CKMBINDEX, TROPONINI in the last 168 hours. BNP (last 3 results) No results for input(s): PROBNP in the last 8760 hours. HbA1C: No results for input(s): HGBA1C in the last 72 hours.  CBG: No results for input(s): GLUCAP in the last 168 hours. Lipid Profile: No results for input(s): CHOL, HDL, LDLCALC, TRIG, CHOLHDL, LDLDIRECT in the last 72 hours. Thyroid Function Tests: Recent Labs    10/15/21 0804  TSH 3.530   Anemia Panel: No results for input(s): VITAMINB12, FOLATE, FERRITIN, TIBC, IRON, RETICCTPCT in the last 72 hours. Sepsis Labs: Recent Labs  Lab 10/10/21 1204 10/10/21 1536  LATICACIDVEN 2.7* 1.6     Recent Results (from the past  240 hour(s))  Blood culture (routine x 2)     Status: Abnormal   Collection Time: 10/10/21 12:04 PM   Specimen: BLOOD  Result Value Ref Range Status   Specimen Description   Final    BLOOD LEFT ANTECUBITAL Performed at [facility], [address] [place], [place] [address]    Special Requests   Final    BOTTLES DRAWN AEROBIC AND ANAEROBIC Blood Culture adequate volume Performed at [facility], [place], [place] [address]    Culture  Setup Time   Final    GRAM POSITIVE COCCI ANAEROBIC BOTTLE ONLY CRITICAL RESULT CALLED TO, READ BACK BY AND VERIFIED WITH: BRANDON BEERS 10/11/21 1459 MU Performed at [facility], [place] [address] [place], [place] [address]    Culture STAPHYLOCOCCUS AUREUS (A)  Final   Report Status 10/14/2021 FINAL  Final   Organism ID, Bacteria STAPHYLOCOCCUS AUREUS  Final       Susceptibility   Staphylococcus aureus - MIC*    CIPROFLOXACIN >=8 RESISTANT Resistant     ERYTHROMYCIN RESISTANT Resistant     GENTAMICIN <=0.5 SENSITIVE Sensitive     OXACILLIN <=0.25 SENSITIVE Sensitive     TETRACYCLINE <=1 SENSITIVE Sensitive     VANCOMYCIN <=0.5 SENSITIVE Sensitive     TRIMETH/SULFA <=10 SENSITIVE Sensitive     CLINDAMYCIN RESISTANT Resistant     RIFAMPIN <=0.5 SENSITIVE Sensitive     Inducible Clindamycin POSITIVE Resistant     * STAPHYLOCOCCUS AUREUS  Blood Culture ID Panel (Reflexed)     Status: Abnormal   Collection Time: 10/10/21 12:04 PM  Result Value Ref Range Status   Enterococcus faecalis NOT DETECTED NOT DETECTED Final   Enterococcus Faecium NOT DETECTED NOT DETECTED Final   Listeria monocytogenes NOT DETECTED NOT DETECTED Final   Staphylococcus species DETECTED (A) NOT DETECTED Final    Comment: CRITICAL RESULT CALLED TO, READ BACK BY AND VERIFIED WITH: BRANDON BEERS 10/11/21 1459 MU    Staphylococcus aureus (BCID) DETECTED (A) NOT DETECTED Final    Comment: CRITICAL RESULT CALLED TO, READ BACK BY AND VERIFIED WITH: BRANDON BEERS 10/11/21 1459 MU    Staphylococcus epidermidis NOT DETECTED NOT DETECTED Final   Staphylococcus lugdunensis NOT DETECTED NOT DETECTED Final   Streptococcus species NOT DETECTED NOT DETECTED Final   Streptococcus agalactiae NOT DETECTED NOT DETECTED Final   Streptococcus pneumoniae NOT DETECTED NOT DETECTED Final   Streptococcus pyogenes NOT DETECTED NOT DETECTED Final   A.calcoaceticus-baumannii NOT DETECTED NOT DETECTED Final   Bacteroides fragilis NOT DETECTED NOT DETECTED Final   Enterobacterales NOT DETECTED NOT DETECTED Final   Enterobacter cloacae complex NOT DETECTED NOT DETECTED Final   Escherichia coli NOT DETECTED NOT DETECTED Final   Klebsiella aerogenes NOT DETECTED NOT DETECTED Final   Klebsiella oxytoca NOT DETECTED NOT DETECTED Final   Klebsiella pneumoniae NOT DETECTED NOT DETECTED Final   Proteus  species NOT DETECTED NOT DETECTED Final   Salmonella species NOT DETECTED NOT DETECTED Final   Serratia marcescens NOT DETECTED NOT DETECTED Final   Haemophilus influenzae NOT DETECTED NOT DETECTED Final   Neisseria meningitidis NOT DETECTED NOT DETECTED Final   Pseudomonas aeruginosa NOT DETECTED NOT DETECTED Final   Stenotrophomonas maltophilia NOT DETECTED NOT DETECTED Final   Candida albicans NOT DETECTED NOT DETECTED Final   Candida auris NOT DETECTED NOT DETECTED Final   Candida glabrata NOT DETECTED NOT DETECTED Final   Candida krusei NOT DETECTED NOT DETECTED Final   Candida parapsilosis NOT DETECTED  NOT DETECTED Final   Candida tropicalis NOT DETECTED NOT DETECTED Final   Cryptococcus neoformans/gattii NOT DETECTED NOT DETECTED Final   Meth resistant mecA/C and MREJ NOT DETECTED NOT DETECTED Final    Comment: Performed at [facility], [address] [place], [place] [address]  Blood culture (routine x 2)     Status: None (Preliminary result)   Collection Time: 10/11/21  1:01 AM   Specimen: BLOOD  Result Value Ref Range Status   Specimen Description BLOOD LEFT HAND  Final   Special Requests   Final    BOTTLES DRAWN AEROBIC AND ANAEROBIC Blood Culture adequate volume   Culture   Final    NO GROWTH 4 DAYS Performed at [facility], [address], [place], [place] [address]    Report Status PENDING  Incomplete  CULTURE, BLOOD (ROUTINE X 2) w Reflex to ID Panel     Status: None (Preliminary result)   Collection Time: 10/13/21  4:40 AM   Specimen: BLOOD  Result Value Ref Range Status   Specimen Description BLOOD BLOOD RIGHT HAND  Final   Special Requests   Final    BOTTLES DRAWN AEROBIC AND ANAEROBIC Blood Culture adequate volume   Culture   Final    NO GROWTH 2 DAYS Performed at [facility], [address], [place], [place] [address]    Report Status PENDING  Incomplete  CULTURE, BLOOD (ROUTINE X 2) w Reflex to ID Panel     Status: None  (Preliminary result)   Collection Time: 10/13/21  4:51 AM   Specimen: BLOOD  Result Value Ref Range Status   Specimen Description BLOOD BLOOD LEFT HAND  Final   Special Requests   Final    BOTTLES DRAWN AEROBIC AND ANAEROBIC Blood Culture adequate volume   Culture   Final    NO GROWTH 2 DAYS Performed at [facility], [address] [place], [place] [address]    Report Status PENDING  Incomplete  Aerobic/Anaerobic Culture w Gram Stain (surgical/deep wound)     Status: None (Preliminary result)   Collection Time: 10/14/21  1:15 PM   Specimen: Foot; Body Fluid  Result Value Ref Range Status   Specimen Description   Final    FOOT Performed at [facility], [address] [place], [place] [address]    Special Requests   Final    NONE FOOT RIGHT Performed at [facility], [address] [place], [place] [address]    Gram Stain   Final    NO SQUAMOUS EPITHELIAL CELLS SEEN RARE WBC SEEN NO ORGANISMS SEEN Performed at [facility], [place] [address], [place], [place] [address]    Culture PENDING  Incomplete   Report Status PENDING  Incomplete      Radiology Studies: ECHOCARDIOGRAM COMPLETE  Result Date: 10/14/2021    ECHOCARDIOGRAM REPORT   Patient Name:   MRS. Drue Flirt Opara Date of Exam: 10/14/2021 Medical Rec #:  [id number]                Height:       67.0 in Accession #:    [id number]               Weight:       180.0 lb Date of Birth:  Mar 07, 1929                BSA:          1.934 m Patient Age:  89 years                 BP:           145/59 mmHg Patient Gender: F                        HR:           73 bpm. Exam Location:  [facility] Procedure: 2D Echo, Cardiac Doppler and Color Doppler Indications:     Bacteremia R78.81  History:         Patient has no prior history of Echocardiogram examinations.                  Risk Factors:Hypertension.  Sonographer:     Sherrie Sport Referring Phys:  [id number] Tsosie Billing Diagnosing Phys: Sargeant  1. Left ventricular ejection fraction, by estimation, is 60 to 65%. The left ventricle has normal function. The left ventricle has no regional wall motion abnormalities. There is mild left ventricular hypertrophy. Left ventricular diastolic parameters are indeterminate.  2. Right ventricular systolic function was not well visualized. The right ventricular size is not well visualized. There is moderately elevated pulmonary artery systolic pressure.  3. Left atrial size was mildly dilated.  4. Right atrial size was mildly dilated.  5. The mitral valve is degenerative. Mild mitral valve regurgitation. No evidence of mitral stenosis.  6. Tricuspid valve regurgitation is moderate.  7. The aortic valve is grossly normal. Aortic valve regurgitation is mild to moderate. Aortic valve sclerosis is present, with no evidence of aortic valve stenosis. Conclusion(s)/Recommendation(s): No evidence of valvular vegetations on this transthoracic echocardiogram. Consider a transesophageal echocardiogram to exclude infective endocarditis if clinically indicated. FINDINGS  Left Ventricle: Left ventricular ejection fraction, by estimation, is 60 to 65%. The left ventricle has normal function. The left ventricle has no regional wall motion abnormalities. The left ventricular internal cavity size was normal in size. There is  mild left ventricular hypertrophy. Left ventricular diastolic parameters are indeterminate. Right Ventricle: The right ventricular size is not well visualized. Right vetricular wall thickness was not well visualized. Right ventricular systolic function was not well visualized. There is moderately elevated pulmonary artery systolic pressure. Left Atrium: Left atrial size was mildly dilated. Right Atrium: Right atrial size was mildly dilated. Pericardium: There is no evidence of pericardial effusion. Mitral Valve: The mitral valve is degenerative in appearance. Mild mitral valve regurgitation. No evidence  of mitral valve stenosis. MV peak gradient, 9.7 mmHg. The mean mitral valve gradient is 3.0 mmHg. Tricuspid Valve: The tricuspid valve is not well visualized. Tricuspid valve regurgitation is moderate . No evidence of tricuspid stenosis. Aortic Valve: The aortic valve is grossly normal. Aortic valve regurgitation is mild to moderate. Aortic regurgitation PHT measures 485 msec. Aortic valve sclerosis is present, with no evidence of aortic valve stenosis. Aortic valve mean gradient measures 7.7 mmHg. Aortic valve peak gradient measures 14.4 mmHg. Aortic valve area, by VTI measures 1.55 cm. Pulmonic Valve: The pulmonic valve was not well visualized. Pulmonic valve regurgitation is not visualized. No evidence of pulmonic stenosis. Aorta: The aortic root is normal in size and structure. Venous: The inferior vena cava was not well visualized. IAS/Shunts: No atrial level shunt detected by color flow Doppler.  LEFT VENTRICLE PLAX 2D LVIDd:         4.30 cm LVIDs:         2.80 cm LV PW:  1.30 cm LV IVS:        1.00 cm LVOT diam:     2.00 cm LV SV:         67 LV SV Index:   35 LVOT Area:     3.14 cm  RIGHT VENTRICLE RV Basal diam:  3.80 cm RV S prime:     8.49 cm/s TAPSE (M-mode): 3.6 cm LEFT ATRIUM           Index        RIGHT ATRIUM           Index LA diam:      5.10 cm 2.64 cm/m   RA Area:     24.10 cm LA Vol (A2C): 70.6 ml 36.51 ml/m  RA Volume:   73.10 ml  37.80 ml/m LA Vol (A4C): 72.3 ml 37.39 ml/m  AORTIC VALVE                     PULMONIC VALVE AV Area (Vmax):    1.53 cm      PV Vmax:        0.60 m/s AV Area (Vmean):   1.52 cm      PV Vmean:       43.900 cm/s AV Area (VTI):     1.55 cm      PV VTI:         0.104 m AV Vmax:           190.00 cm/s   PV Peak grad:   1.4 mmHg AV Vmean:          129.667 cm/s  PV Mean grad:   1.0 mmHg AV VTI:            0.433 m       RVOT Peak grad: 1 mmHg AV Peak Grad:      14.4 mmHg AV Mean Grad:      7.7 mmHg LVOT Vmax:         92.70 cm/s LVOT Vmean:        62.600 cm/s  LVOT VTI:          0.214 m LVOT/AV VTI ratio: 0.49 AI PHT:            485 msec  AORTA Ao Root diam: 2.60 cm MITRAL VALVE                TRICUSPID VALVE MV Area (PHT): 4.49 cm     TR Peak grad:   56.2 mmHg MV Area VTI:   2.64 cm     TR Vmax:        375.00 cm/s MV Peak grad:  9.7 mmHg MV Mean grad:  3.0 mmHg     SHUNTS MV Vmax:       1.56 m/s     Systemic VTI:  0.21 m MV Vmean:      80.6 cm/s    Systemic Diam: 2.00 cm MV Decel Time: 169 msec     Pulmonic VTI:  0.090 m MV E velocity: 114.00 cm/s Donnelly Angelica Electronically signed by Donnelly Angelica Signature Date/Time: 10/14/2021/12:48:48 PM    Final     Scheduled Meds:  bumetanide  1 mg Oral Daily   cholecalciferol  1,000 Units Oral Daily   colchicine  0.6 mg Oral Daily   diclofenac Sodium  1 application Topical QID   enoxaparin (LOVENOX) injection  40 mg Subcutaneous Q24H   fluticasone  1 spray Each Nare BID   levothyroxine  100 mcg Oral Q0600   lidocaine  5 mL Intradermal Once   Continuous Infusions:   ceFAZolin (ANCEF) IV 2 g (10/15/21 1133)     LOS: 5 days   Time spent: 38 minutes. More than 50% of the time was spent in counseling/coordination of care  Lorella Nimrod, MD [facility]  If 7PM-7AM, please contact night-coverage [email or web address]  10/15/2021, 3:26 PM   This record has been created using Systems analyst. Errors have been sought and corrected,but may not always be located. Such creation errors do not reflect on the standard of care.

## 2021-10-15 NOTE — Progress Notes (Signed)
Date of Admission:  10/10/2021      ID: Latoya Jacobs is a 88 y.o. female with   Principal Problem:   MSSA bacteremia Active Problems:   Cellulitis of right foot   Essential hypertension   PAF (paroxysmal atrial fibrillation) (HCC)   Gout   Chronic diastolic CHF (congestive heart failure) (HCC)   Hypothyroidism   Endometrial carcinoma (HCC)   Chronic kidney disease, stage 3a (HCC)    Subjective: Pt is feeling better Says rt foot swelling is better Had aspiration of the rt MTP joint yesterday No fever  Medications:   bumetanide  1 mg Oral Daily   cholecalciferol  1,000 Units Oral Daily   colchicine  0.6 mg Oral Daily   diclofenac Sodium  1 application Topical QID   enoxaparin (LOVENOX) injection  40 mg Subcutaneous Q24H   fluticasone  1 spray Each Nare BID   levothyroxine  100 mcg Oral Q0600   lidocaine  5 mL Intradermal Once    Objective: Vital signs in last 24 hours: Temp:  [97.6 F (36.4 C)-98.5 F (36.9 C)] 97.7 F (36.5 C) (01/03 1124) Pulse Rate:  [49-81] 81 (01/03 1124) Resp:  [14-17] 17 (01/03 1124) BP: (144-164)/(46-82) 154/82 (01/03 1124) SpO2:  [95 %-96 %] 96 % (01/03 1124)  PHYSICAL EXAM:  General: Alert, cooperative, no distress, appears stated age.  Lungs: Clear to auscultation bilaterally. No Wheezing or Rhonchi. No rales. Heart: irregular Abdomen: Soft, non-tender,not distended. Bowel sounds normal. No masses Extremities: rt foot swelling reduced- erythema reduced- Now it is focused around the great toe area. Skin: No rashes or lesions. Or bruising Lymph: Cervical, supraclavicular normal. Neurologic: Grossly non-focal  Lab Results Recent Labs    10/15/21 0804  NA 139  K 3.3*  CL 99  CO2 30  BUN 22  CREATININE 0.90   Liver Panel No results for input(s): PROT, ALBUMIN, AST, ALT, ALKPHOS, BILITOT, BILIDIR, IBILI in the last 72 hours. Sedimentation Rate No results for input(s): ESRSEDRATE in the last 72 hours. C-Reactive  Protein No results for input(s): CRP in the last 72 hours.  Microbiology:  Studies/Results: ECHOCARDIOGRAM COMPLETE  Result Date: 10/14/2021    ECHOCARDIOGRAM REPORT   Patient Name:   MRS. Latoya Jacobs Date of Exam: 10/14/2021 Medical Rec #:  [id number]                Height:       67.0 in Accession #:    [id number]               Weight:       180.0 lb Date of Birth:  [birth year]                BSA:          1.934 m Patient Age:    24 years                 BP:           145/59 mmHg Patient Gender: F                        HR:           73 bpm. Exam Location:  [facility] Procedure: 2D Echo, Cardiac Doppler and Color Doppler Indications:     Bacteremia R78.81  History:         Patient has no prior history of Echocardiogram examinations.  Risk Factors:Hypertension.  Sonographer:     Sherrie Sport Referring Phys:  [id number] Tsosie Billing Diagnosing Phys: Latoya Jacobs  1. Left ventricular ejection fraction, by estimation, is 60 to 65%. The left ventricle has normal function. The left ventricle has no regional wall motion abnormalities. There is mild left ventricular hypertrophy. Left ventricular diastolic parameters are indeterminate.  2. Right ventricular systolic function was not well visualized. The right ventricular size is not well visualized. There is moderately elevated pulmonary artery systolic pressure.  3. Left atrial size was mildly dilated.  4. Right atrial size was mildly dilated.  5. The mitral valve is degenerative. Mild mitral valve regurgitation. No evidence of mitral stenosis.  6. Tricuspid valve regurgitation is moderate.  7. The aortic valve is grossly normal. Aortic valve regurgitation is mild to moderate. Aortic valve sclerosis is present, with no evidence of aortic valve stenosis. Conclusion(s)/Recommendation(s): No evidence of valvular vegetations on this transthoracic echocardiogram. Consider a transesophageal echocardiogram to exclude infective endocarditis if  clinically indicated. FINDINGS  Left Ventricle: Left ventricular ejection fraction, by estimation, is 60 to 65%. The left ventricle has normal function. The left ventricle has no regional wall motion abnormalities. The left ventricular internal cavity size was normal in size. There is  mild left ventricular hypertrophy. Left ventricular diastolic parameters are indeterminate. Right Ventricle: The right ventricular size is not well visualized. Right vetricular wall thickness was not well visualized. Right ventricular systolic function was not well visualized. There is moderately elevated pulmonary artery systolic pressure. Left Atrium: Left atrial size was mildly dilated. Right Atrium: Right atrial size was mildly dilated. Pericardium: There is no evidence of pericardial effusion. Mitral Valve: The mitral valve is degenerative in appearance. Mild mitral valve regurgitation. No evidence of mitral valve stenosis. MV peak gradient, 9.7 mmHg. The mean mitral valve gradient is 3.0 mmHg. Tricuspid Valve: The tricuspid valve is not well visualized. Tricuspid valve regurgitation is moderate . No evidence of tricuspid stenosis. Aortic Valve: The aortic valve is grossly normal. Aortic valve regurgitation is mild to moderate. Aortic regurgitation PHT measures 485 msec. Aortic valve sclerosis is present, with no evidence of aortic valve stenosis. Aortic valve mean gradient measures 7.7 mmHg. Aortic valve peak gradient measures 14.4 mmHg. Aortic valve area, by VTI measures 1.55 cm. Pulmonic Valve: The pulmonic valve was not well visualized. Pulmonic valve regurgitation is not visualized. No evidence of pulmonic stenosis. Aorta: The aortic root is normal in size and structure. Venous: The inferior vena cava was not well visualized. IAS/Shunts: No atrial level shunt detected by color flow Doppler.  LEFT VENTRICLE PLAX 2D LVIDd:         4.30 cm LVIDs:         2.80 cm LV PW:         1.30 cm LV IVS:        1.00 cm LVOT diam:     2.00  cm LV SV:         67 LV SV Index:   35 LVOT Area:     3.14 cm  RIGHT VENTRICLE RV Basal diam:  3.80 cm RV S prime:     8.49 cm/s TAPSE (M-mode): 3.6 cm LEFT ATRIUM           Index        RIGHT ATRIUM           Index LA diam:      5.10 cm 2.64 cm/m   RA Area:     24.10 cm  LA Vol (A2C): 70.6 ml 36.51 ml/m  RA Volume:   73.10 ml  37.80 ml/m LA Vol (A4C): 72.3 ml 37.39 ml/m  AORTIC VALVE                     PULMONIC VALVE AV Area (Vmax):    1.53 cm      PV Vmax:        0.60 m/s AV Area (Vmean):   1.52 cm      PV Vmean:       43.900 cm/s AV Area (VTI):     1.55 cm      PV VTI:         0.104 m AV Vmax:           190.00 cm/s   PV Peak grad:   1.4 mmHg AV Vmean:          129.667 cm/s  PV Mean grad:   1.0 mmHg AV VTI:            0.433 m       RVOT Peak grad: 1 mmHg AV Peak Grad:      14.4 mmHg AV Mean Grad:      7.7 mmHg LVOT Vmax:         92.70 cm/s LVOT Vmean:        62.600 cm/s LVOT VTI:          0.214 m LVOT/AV VTI ratio: 0.49 AI PHT:            485 msec  AORTA Ao Root diam: 2.60 cm MITRAL VALVE                TRICUSPID VALVE MV Area (PHT): 4.49 cm     TR Peak grad:   56.2 mmHg MV Area VTI:   2.64 cm     TR Vmax:        375.00 cm/s MV Peak grad:  9.7 mmHg MV Mean grad:  3.0 mmHg     SHUNTS MV Vmax:       1.56 m/s     Systemic VTI:  0.21 m MV Vmean:      80.6 cm/s    Systemic Diam: 2.00 cm MV Decel Time: 169 msec     Pulmonic VTI:  0.090 m MV E velocity: 114.00 cm/s Donnelly Angelica Electronically signed by Donnelly Angelica Signature Date/Time: 10/14/2021/12:48:48 PM    Final      Assessment/Plan: ?MSSA bacteremia - source is from the rt foot- ton cefazolin- repeat blood culture from 10/13/21 neg so far-  d echo no vegetaion She is not interested in TEE The rt great toe MTP joint was aspirated yesterday- IT is aclot and hence no cell count could be done- No urate crystals Concern is septic arthritis Jefm Miles or just cellulitis Pt initially agreed to have PICC but refused later- wants to be treated with PO antibiotic The  followin g are some of the options Will treat like septic arthritis with 4 weeks of Iv cefazolin, As not interested in PICC the following are some options- Po linezoild, weekly IV dalbavancin X 4  or diclox or cephalexin Will await synovial fluid culture result as per request of patient and decide on further management  H/o endometrial ca- s/p radiation Hypothyroidism- on synthroid   Afib- rate controlled on Propanolol    Anemia     Mildly elevated glucose- has  pre diabetes- not on any meds at home    Hyperuricemia- on uric  acid -synovial fluid no urate crystals  Discussed the management with patient and her daughter and hospitalist

## 2021-10-15 NOTE — Progress Notes (Signed)
This nurse just received notification from central monitoring of pause of 2.59 seconds. EKG being completed now. MD notified, awaiting response. Will continue to monitor.

## 2021-10-15 NOTE — Telephone Encounter (Signed)
"  I would like someone to call me and let me know what is going on with my mother.  I've had three different doctors telling me different things.  Please give me a call."  I am returning your call.  "Yes, we just want to know what is going on with my mom.  We've seen so many people."  Dr. Amalia Hailey said to let you know that he's going to be making rounds this evening around 6pm or 6:30 pm.  He will answer your questions at that time.  "Oh great, that will be awesome."

## 2021-10-16 DIAGNOSIS — B9561 Methicillin susceptible Staphylococcus aureus infection as the cause of diseases classified elsewhere: Secondary | ICD-10-CM | POA: Diagnosis not present

## 2021-10-16 DIAGNOSIS — R7881 Bacteremia: Secondary | ICD-10-CM | POA: Diagnosis not present

## 2021-10-16 LAB — BASIC METABOLIC PANEL
Anion gap: 10 (ref 5–15)
BUN: 23 mg/dL (ref 8–23)
CO2: 30 mmol/L (ref 22–32)
Calcium: 8.5 mg/dL — ABNORMAL LOW (ref 8.9–10.3)
Chloride: 100 mmol/L (ref 98–111)
Creatinine, Ser: 1 mg/dL (ref 0.44–1.00)
GFR, Estimated: 54 mL/min — ABNORMAL LOW (ref >60)
Glucose, Bld: 103 mg/dL — ABNORMAL HIGH (ref 70–99)
Potassium: 3.3 mmol/L — ABNORMAL LOW (ref 3.5–5.1)
Sodium: 140 mmol/L (ref 135–145)

## 2021-10-16 LAB — CULTURE, BLOOD (ROUTINE X 2)
Culture: NO GROWTH
Special Requests: ADEQUATE

## 2021-10-16 MED ORDER — POTASSIUM CHLORIDE CRYS ER 20 MEQ PO TBCR
40.0000 meq | EXTENDED_RELEASE_TABLET | Freq: Once | ORAL | Status: AC
  Administered 2021-10-16: 40 meq via ORAL
  Filled 2021-10-16: qty 2

## 2021-10-16 MED ORDER — POTASSIUM CHLORIDE CRYS ER 20 MEQ PO TBCR: 20.0000 meq | EXTENDED_RELEASE_TABLET | Freq: Every day | ORAL | Status: DC

## 2021-10-16 MED ORDER — CEPHALEXIN 500 MG PO CAPS: 500.0000 mg | ORAL_CAPSULE | Freq: Four times a day (QID) | ORAL | 0 refills | Status: AC

## 2021-10-16 MED ORDER — PROPRANOLOL HCL ER 120 MG PO CP24
120.0000 mg | ORAL_CAPSULE | Freq: Every day | ORAL | Status: DC
  Administered 2021-10-16: 120 mg via ORAL
  Filled 2021-10-16: qty 1

## 2021-10-16 NOTE — Plan of Care (Signed)
°  Problem: Education: Goal: Knowledge of General Education information will improve Description: Including pain rating scale, medication(s)/side effects and non-pharmacologic comfort measures 10/16/2021 1455 by Trula Slade, RN Outcome: Adequate for Discharge 10/16/2021 0746 by Trula Slade, RN Outcome: Progressing   Problem: Health Behavior/Discharge Planning: Goal: Ability to manage health-related needs will improve 10/16/2021 1455 by Trula Slade, RN Outcome: Adequate for Discharge 10/16/2021 0746 by Trula Slade, RN Outcome: Progressing   Problem: Clinical Measurements: Goal: Ability to maintain clinical measurements within normal limits will improve 10/16/2021 1455 by Trula Slade, RN Outcome: Adequate for Discharge 10/16/2021 0746 by Trula Slade, RN Outcome: Progressing Goal: Will remain free from infection 10/16/2021 1455 by Trula Slade, RN Outcome: Adequate for Discharge 10/16/2021 0746 by Trula Slade, RN Outcome: Progressing Goal: Diagnostic test results will improve 10/16/2021 1455 by Trula Slade, RN Outcome: Adequate for Discharge 10/16/2021 0746 by Trula Slade, RN Outcome: Progressing Goal: Respiratory complications will improve 10/16/2021 1455 by Trula Slade, RN Outcome: Adequate for Discharge 10/16/2021 0746 by Trula Slade, RN Outcome: Progressing Goal: Cardiovascular complication will be avoided 10/16/2021 1455 by Trula Slade, RN Outcome: Adequate for Discharge 10/16/2021 0746 by Trula Slade, RN Outcome: Progressing   Problem: Activity: Goal: Risk for activity intolerance will decrease 10/16/2021 1455 by Trula Slade, RN Outcome: Adequate for Discharge 10/16/2021 0746 by Trula Slade, RN Outcome: Progressing   Problem: Nutrition: Goal: Adequate nutrition will be maintained 10/16/2021 1455 by Trula Slade, RN Outcome: Adequate for Discharge 10/16/2021 0746 by Trula Slade, RN Outcome:  Progressing   Problem: Coping: Goal: Level of anxiety will decrease 10/16/2021 1455 by Trula Slade, RN Outcome: Adequate for Discharge 10/16/2021 0746 by Trula Slade, RN Outcome: Progressing   Problem: Pain Managment: Goal: General experience of comfort will improve 10/16/2021 1455 by Trula Slade, RN Outcome: Adequate for Discharge 10/16/2021 0746 by Trula Slade, RN Outcome: Progressing   Problem: Skin Integrity: Goal: Risk for impaired skin integrity will decrease 10/16/2021 1455 by Trula Slade, RN Outcome: Adequate for Discharge 10/16/2021 0746 by Trula Slade, RN Outcome: Progressing   Problem: Skin Integrity: Goal: Skin integrity will improve 10/16/2021 1455 by Trula Slade, RN Outcome: Adequate for Discharge 10/16/2021 0746 by Trula Slade, RN Outcome: Progressing

## 2021-10-16 NOTE — Plan of Care (Signed)
°  Problem: Education: Goal: Knowledge of General Education information will improve Description: Including pain rating scale, medication(s)/side effects and non-pharmacologic comfort measures Outcome: Progressing   Problem: Health Behavior/Discharge Planning: Goal: Ability to manage health-related needs will improve Outcome: Progressing   Problem: Clinical Measurements: Goal: Ability to maintain clinical measurements within normal limits will improve Outcome: Progressing Goal: Will remain free from infection Outcome: Progressing Goal: Diagnostic test results will improve Outcome: Progressing Goal: Respiratory complications will improve Outcome: Progressing Goal: Cardiovascular complication will be avoided Outcome: Progressing   Problem: Activity: Goal: Risk for activity intolerance will decrease Outcome: Progressing   Problem: Nutrition: Goal: Adequate nutrition will be maintained Outcome: Progressing   Problem: Coping: Goal: Level of anxiety will decrease Outcome: Progressing   Problem: Pain Managment: Goal: General experience of comfort will improve Outcome: Progressing   Problem: Elimination: Goal: Will not experience complications related to bowel motility Outcome: Progressing Goal: Will not experience complications related to urinary retention Outcome: Progressing   Problem: Pain Managment: Goal: General experience of comfort will improve Outcome: Progressing   Problem: Safety: Goal: Ability to remain free from injury will improve Outcome: Progressing

## 2021-10-16 NOTE — TOC Initial Note (Addendum)
Transition of Care [facility]) - Initial/Assessment Note    Patient Details  Name: Latoya Jacobs MRN: [id number] Date of Birth: Apr 24, 1929  [facility] CM/SW Contact:    Candie Chroman, LCSW Phone Number: 10/16/2021, 12:51 PM  Clinical Narrative:   CSW met with patient. Daughter at bedside. CSW introduced role and explained that PT recommendations would be discussed. Patient is agreeable to home health and prefers [facility] because she has worked with them in the past. She will be staying in [place] until well enough to return to [place]. Address obtained. Patient does not have a PCP but notes a Dr. Donnal Debar at [facility] in [place]. [facility] representative will check PCP info from when they worked with her in 2021. They can set her up with someone here if needed for home health orders. Patient is requesting a walker since hers is in [place]. Will ask MD to order. No further concerns. CSW encouraged patient and her daughter to contact CSW as needed. CSW will continue to follow patient and her daughter for support and facilitate return home when stable.           1:22 pm: Notified [facility] representative about plan for abx infusion at same-day surgery once per week. Per request, sent secure chat to podiatrist to see if he would be willing to cover [facility] orders since she does not have a PCP.    Expected Discharge Plan: [place] Barriers to Discharge: Continued Medical Work up   Patient Goals and CMS Choice     Choice offered to / list presented to : NA  Expected Discharge Plan and Services Expected Discharge Plan: [place] Choice: Home Health, Durable Medical Equipment Living arrangements for the past 2 months: Single Family Home                           HH Arranged: PT, RN [facility] Agency: [place] Date [facility] Agency Contacted: 10/16/21   Representative spoke with at [place]: Sharmon Revere  Prior Living Arrangements/Services Living arrangements for the past 2 months: [place]   Patient language and need for interpreter reviewed:: Yes Do you feel safe going back to the place where you live?: Yes      Need for Family Participation in Patient Care: Yes (Comment) Care giver support system in place?: Yes (comment) Current home services: DME Criminal Activity/Legal Involvement Pertinent to Current Situation/Hospitalization: No - Comment as needed  Activities of Daily Living Home Assistive Devices/Equipment: None ADL Screening (condition at time of admission) Patient's cognitive ability adequate to safely complete daily activities?: Yes Is the patient deaf or have difficulty hearing?: No Does the patient have difficulty seeing, even when wearing glasses/contacts?: No Does the patient have difficulty concentrating, remembering, or making decisions?: No Patient able to express need for assistance with ADLs?: Yes Does the patient have difficulty dressing or bathing?: No Independently performs ADLs?: Yes (appropriate for developmental age) Does the patient have difficulty walking or climbing stairs?: No Weakness of Legs: None Weakness of Arms/Hands: None  Permission Sought/Granted Permission sought to share information with : Facility Art therapist granted to share information with : Yes, Verbal Permission Granted  Share Information with NAME: Lowell Bouton  Permission granted to share info w AGENCY: [place] granted to share info w Relationship: Daughter  Permission granted to share info  w Contact Information: [phone]  Emotional Assessment Appearance:: Appears stated age Attitude/Demeanor/Rapport: Engaged, Gracious Affect (typically observed): Accepting, Appropriate, Calm, Pleasant Orientation: : Oriented to Self, Oriented to Place, Oriented to  Time, Oriented to Situation Alcohol / Substance Use: Not  Applicable Psych Involvement: No (comment)  Admission diagnosis:  Cellulitis of right foot [L03.115] Patient Active Problem List   Diagnosis Date Noted   Chronic diastolic CHF (congestive heart failure) ([place]) 10/12/2021   Hypothyroidism 10/12/2021   Endometrial carcinoma ([place]) 10/12/2021   MSSA bacteremia 10/12/2021   Chronic kidney disease, stage 3a ([place]) 10/12/2021   Gout    Essential hypertension 10/11/2021   PAF (paroxysmal atrial fibrillation) ([place]) 10/11/2021   Cellulitis of right foot 10/10/2021   PCP:  Pcp, No Pharmacy:   [facility] [place], [place] [address] Phone: [phone] Fax: [phone]     Social Determinants of Health (SDOH) Interventions    Readmission Risk Interventions No flowsheet data found.

## 2021-10-16 NOTE — Evaluation (Signed)
Physical Therapy Evaluation Patient Details Name: Latoya Jacobs MRN: [id number] DOB: 12/23/1928 Today's Date: 10/16/2021  History of Present Illness  Pt admitted for MSSA bactermia secondary to R foot cellulitis. Pt is s/p joint aspiration on 10/14/21. HIstory includes endometrical carcinoma, Afib, and HTN.  Clinical Impression  Pt is a pleasant 88 year old female who was admitted for MSSA bacteremia. Pt performs transfers and ambulation with supervision and RW. Pt is very close to baseline level and will had adequate assistance from daughter at home. Pt demonstrates deficits with pain/mobility/balance. Would benefit from skilled PT to address above deficits and promote optimal return to PLOF. Recommend transition to [place] upon discharge from acute hospitalization.      Recommendations for follow up therapy are one component of a multi-disciplinary discharge planning process, led by the attending physician.  Recommendations may be updated based on patient status, additional functional criteria and insurance authorization.  Follow Up Recommendations Home health PT    Assistance Recommended at Discharge PRN  Patient can return home with the following  Help with stairs or ramp for entrance;A little help with walking and/or transfers    Equipment Recommendations None recommended by PT  Recommendations for Other Services       Functional Status Assessment Patient has had a recent decline in their functional status and demonstrates the ability to make significant improvements in function in a reasonable and predictable amount of time.     Precautions / Restrictions Precautions Precautions: Fall Restrictions Weight Bearing Restrictions: No      Mobility  Bed Mobility               General bed mobility comments: recevied in recliner at beginning and end of session    Transfers Overall transfer level: Needs assistance Equipment used: Rolling walker (2 wheels) Transfers: Sit  to/from Stand Sit to Stand: Supervision           General transfer comment: rocks and uses momentum prior to transfer to standing. RW used with safe technique. Education provided on transfers including higher surfaces to maintain independence    Ambulation/Gait Ambulation/Gait assistance: Supervision Gait Distance (Feet): 50 Feet Assistive device: Rolling walker (2 wheels) Gait Pattern/deviations: Step-through pattern       General Gait Details: ambulated around room with good weight acceptance on R LE. RW used  Stairs            Wheelchair Mobility    Modified Rankin (Stroke Patients Only)       Balance Overall balance assessment: Mild deficits observed, not formally tested                                           Pertinent Vitals/Pain Pain Assessment: 0-10 Pain Score: 4  Pain Location: R foot Pain Descriptors / Indicators: Aching Pain Intervention(s): Limited activity within patient's tolerance    Home Living Family/patient expects to be discharged to:: Private residence Living Arrangements: Alone (plans to stay with daughter locally until recovered) Available Help at Discharge: Family;Available 24 hours/day Type of Home: House Home Access: Stairs to enter Entrance Stairs-Rails: Can reach both Entrance Stairs-Number of Steps: 2 Alternate Level Stairs-Number of Steps: has stair lift to get to 2nd story Home Layout: Two level;Bed/bath upstairs Home Equipment: Rolling Walker (2 wheels);BSC/3in1      Prior Function Prior Level of Function : Independent/Modified Independent  Mobility Comments: using RW       Hand Dominance        Extremity/Trunk Assessment   Upper Extremity Assessment Upper Extremity Assessment: Overall WFL for tasks assessed    Lower Extremity Assessment Lower Extremity Assessment: Overall WFL for tasks assessed (R LE neuropathy)       Communication   Communication: No difficulties   Cognition Arousal/Alertness: Awake/alert Behavior During Therapy: WFL for tasks assessed/performed Overall Cognitive Status: Within Functional Limits for tasks assessed                                          General Comments      Exercises     Assessment/Plan    PT Assessment Patient needs continued PT services  PT Problem List Decreased activity tolerance;Decreased balance;Decreased mobility;Pain;Impaired sensation       PT Treatment Interventions Gait training;Therapeutic exercise;Balance training    PT Goals (Current goals can be found in the Care Plan section)  Acute Rehab PT Goals Patient Stated Goal: to go home PT Goal Formulation: With patient Time For Goal Achievement: 10/30/21 Potential to Achieve Goals: Good    Frequency Min 2X/week     Co-evaluation               AM-PAC PT "6 Clicks" Mobility  Outcome Measure Help needed turning from your back to your side while in a flat bed without using bedrails?: None Help needed moving from lying on your back to sitting on the side of a flat bed without using bedrails?: None Help needed moving to and from a bed to a chair (including a wheelchair)?: None Help needed standing up from a chair using your arms (e.g., wheelchair or bedside chair)?: None Help needed to walk in hospital room?: A Little Help needed climbing 3-5 steps with a railing? : A Little 6 Click Score: 22    End of Session Equipment Utilized During Treatment: Gait belt Activity Tolerance: Patient tolerated treatment well Patient left: in chair;with family/visitor present Nurse Communication: Mobility status PT Visit Diagnosis: Difficulty in walking, not elsewhere classified (R26.2);Pain Pain - Right/Left: Right Pain - part of body: Ankle and joints of foot    Time: 1022-1038 PT Time Calculation (min) (ACUTE ONLY): 16 min   Charges:   PT Evaluation $PT Eval Low Complexity: 1 Low PT Treatments $Gait Training: 8-22  mins        Greggory Stallion, PT, DPT [phone]   Shalawn Wynder 10/16/2021, 1:50 PM

## 2021-10-16 NOTE — Treatment Plan (Signed)
Diagnosis: MSSA bacteremia, MSSA septic arthritis great toe, with cellulitis Baseline Creatinine 1   No Known Allergies  OPAT Orders Discharge antibiotics: Dalbavancin every week for 4 weeks 1St dose of 157m tomorrow 10/17/21 at the day surgery  10/24/21 Dalbavancin 5066mIV  10/31/21- Dalbavancin 50058mV 11/07/21 Dalbavancin 500m21m  Duration: 4 weeks End Date: 11/07/21 Keflex 500mg28mQ 6hrs until 10/31/21   Labs weekly on the day she is at the day surgery center while on IV antibiotics: _X_ CBC with differential  _X_ CMP  _X_ ESR    Clinic Follow Up Appt: 10/31/21 9.45 am   Call [phone] any questions

## 2021-10-16 NOTE — Discharge Summary (Signed)
Physician Discharge Summary  Latoya Jacobs LPF:[id number] DOB: [birth year] DOA: 10/10/2021  PCP: Merryl Hacker, No  Admit date: 10/10/2021 Discharge date: 10/16/2021  Admitted From: home Disposition:  [place]  Recommendations for Outpatient Follow-up:  Follow up with PCP in 1-2 weeks, call Dr. Raelene Bott office for abx Please obtain BMP/CBC in one week Follow-up with weekly infusion of antibiotics in the infusion clinic   Home Health:YES  Equipment/Devices: YES  Discharge Condition: Stable Code Status:   Code Status: DNR Diet recommendation:  Diet Order             Diet regular Room service appropriate? Yes; Fluid consistency: Thin  Diet effective now                    Brief/Interim Summary: 88 y.o. female with known history of endometrial carcinoma, paroxysmal atrial fibrillation, hypertension, congestive heart failure, hypothyroidism who is visiting Kenna [place] from [place] presently living with her daughter started noticing increasing redness and swelling of the right lower extremity from foot up to the mid calf.  Patient's swelling started off with a small callus-like abnormality in the right great toe which patient tried to remove following which it became more swollen and started having pus coming out of it and gradually the swelling worsened and involve the whole right foot and also the distal half of the right leg.  Patient had gone to podiatrist today Dr. Posey Pronto who advised to come to the ER for IV antibiotics and further work-up.MRI shows features concerning for possible osteomyelitis of the first metatarsal.  Patient has been started on empiric antibiotics.  Blood cultures with MSSA bacteremia.Podiatry still debating between osteomyelitis/cellulitis/gout flare.  He also started her on colchicine. Repeat blood cultures negative SO FAR.TTE was negative for any vegetation, patient is refusing TEE.  Right big toe joint aspiration was negative for crystals, no organisms seen on  culture yet. ID recommended Ancef for 4 weeks but patient does not want her PICC line, she may be interested with oral and weekly IV option. She is being set up for home with home health.  Discharge Diagnoses:   MSSA bacteremia Cellulitis of the right foot Possible septic arthritis right great toe Concern with osteomyelitis of the right great toe: Treated with IV cefazolin as per ID.  Source of bacteremia unclear ID concerned about septic arthritis recommending 4 weeks of IV antibiotics and PICC line. repeat blood 1/1 no growth.  Foot right joint aspiration 1/2 no growth.  There was also Concern for osteomyelitis of the right great toe: Seen by podiatry- did trial for gout rx with  colchicine- but no previous history of gout, joint aspirate  10/14/21 negative for crystals. Patient does not want PICC line although understands that the best treatment she may be open to oral and weekly IV antibiotic option,, notified ID. After further discussion by ID with the patient and family plan is to discharge on oral Keflex 500 mg 4 times a day x2 weeks see ID in 2 weeks, and also continued weekly infusion of IV antibiotics starting from tomorrow.  Essential hypertension Chronic CHF with preserved EF: Blood pressure controlled, patient is euvolemic continue home Bumex, on discharge resume Home Inderal Net IO Since Admission: 2,460 mL [10/16/21 1415]   PAF: Not on AC at home due to history of bleeding, continue home Inderal upon discharge Hypothyroidism-continue home Synthroid Endometrial carcinoma history of follow-up with her doctor  Chronic kidney disease, stage 3a: Renal function is stable and at baseline. Recent Labs  Lab 10/10/21 1204 10/11/21 0517 10/12/21 0419 10/15/21 0804 10/16/21 0400  BUN 28* 28* 26* 22 23  CREATININE 1.12* 0.94 0.94 0.90 1.00    Consults: ID Podiatry  Subjective: Alert awake oriented this morning, daughter at the bedside, patient wanting to go home does not want  PICC line or TEE Discharge Exam: Vitals:   10/16/21 0811 10/16/21 1212  BP: (!) 163/68 (!) 176/65  Pulse: 73 66  Resp: 15 20  Temp: 98 F (36.7 C) 98 F (36.7 C)  SpO2: 95% 97%   General: Pt is alert, awake, not in acute distress Cardiovascular: RRR, S1/S2 +, no rubs, no gallops Respiratory: CTA bilaterally, no wheezing, no rhonchi Abdominal: Soft, NT, ND, bowel sounds + Extremities: no edema, no cyanosis  Discharge Instructions  Discharge Instructions     Discharge instructions   Complete by: As directed    Continue oral antibiotics until restarted by your doctor-Dr. Levester Fresh, follow-up with weekly infusion of antibiotics at the infusion clinic call Dr. Raelene Bott office for more detailed  Please call call MD or return to ER for similar or worsening recurring problem that brought you to hospital or if any fever,nausea/vomiting,abdominal pain, uncontrolled pain, chest pain,  shortness of breath or any other alarming symptoms.  Please follow-up your doctor as instructed in a week time and call the office for appointment.  Please avoid alcohol, smoking, or any other illicit substance and maintain healthy habits including taking your regular medications as prescribed.  You were cared for by a hospitalist during your hospital stay. If you have any questions about your discharge medications or the care you received while you were in the hospital after you are discharged, you can call the unit and ask to speak with the hospitalist on call if the hospitalist that took care of you is not available.  Once you are discharged, your primary care physician will handle any further medical issues. Please note that NO REFILLS for any discharge medications will be authorized once you are discharged, as it is imperative that you return to your primary care physician (or establish a relationship with a primary care physician if you do not have one) for your aftercare needs so that they can  reassess your need for medications and monitor your lab values   Increase activity slowly   Complete by: As directed       Allergies as of 10/16/2021   No Known Allergies      Medication List     TAKE these medications    acetaminophen 325 MG suppository Commonly known as: TYLENOL Place 325 mg rectally every 4 (four) hours as needed.   acetaminophen 325 MG tablet Commonly known as: TYLENOL Take 650 mg by mouth every 4 (four) hours as needed.   bumetanide 1 MG tablet Commonly known as: BUMEX Take 1 mg by mouth daily.   cephALEXin 500 MG capsule Commonly known as: KEFLEX Take 1 capsule (500 mg total) by mouth 4 (four) times daily for 14 days.   cholecalciferol 25 MCG (1000 UNIT) tablet Commonly known as: VITAMIN D Take 1,000 Units by mouth daily.   diclofenac Sodium 1 % Gel Commonly known as: VOLTAREN Apply 1 application topically 4 (four) times daily.   fluticasone 50 MCG/ACT nasal spray Commonly known as: FLONASE Place 1 spray into both nostrils 2 (two) times daily.   indapamide 1.25 MG tablet Commonly known as: LOZOL Take 1.25 mg by mouth daily.   levothyroxine 100 MCG tablet Commonly known as: SYNTHROID  Take 100 mcg by mouth daily before breakfast.   Synthroid 112 MCG tablet Generic drug: levothyroxine Take 112 mcg by mouth daily.   propranolol ER 120 MG 24 hr capsule Commonly known as: INDERAL LA Take 120 mg by mouth daily.        Follow-up Information     Tsosie Billing, MD. Call in 1 day(s).   Specialty: Infectious Diseases Why: for weekly iv antibiotics Contact information: [place] [address] [phone]                No Known Allergies  The results of significant diagnostics from this hospitalization (including imaging, microbiology, ancillary and laboratory) are listed below for reference.    Microbiology: Recent Results (from the past 240 hour(s))  Blood culture (routine x 2)     Status:  Abnormal   Collection Time: 10/10/21 12:04 PM   Specimen: BLOOD  Result Value Ref Range Status   Specimen Description   Final    BLOOD LEFT ANTECUBITAL Performed at [facility], [address] [place], [place] [address]    Special Requests   Final    BOTTLES DRAWN AEROBIC AND ANAEROBIC Blood Culture adequate volume Performed at [facility], [address] [place], [place] [address]    Culture  Setup Time   Final    GRAM POSITIVE COCCI ANAEROBIC BOTTLE ONLY CRITICAL RESULT CALLED TO, READ BACK BY AND VERIFIED WITH: BRANDON BEERS 10/11/21 1459 MU Performed at [facility], [place] [address] [place], [place] [address]    Culture STAPHYLOCOCCUS AUREUS (A)  Final   Report Status 10/14/2021 FINAL  Final   Organism ID, Bacteria STAPHYLOCOCCUS AUREUS  Final      Susceptibility   Staphylococcus aureus - MIC*    CIPROFLOXACIN >=8 RESISTANT Resistant     ERYTHROMYCIN RESISTANT Resistant     GENTAMICIN <=0.5 SENSITIVE Sensitive     OXACILLIN <=0.25 SENSITIVE Sensitive     TETRACYCLINE <=1 SENSITIVE Sensitive     VANCOMYCIN <=0.5 SENSITIVE Sensitive     TRIMETH/SULFA <=10 SENSITIVE Sensitive     CLINDAMYCIN RESISTANT Resistant     RIFAMPIN <=0.5 SENSITIVE Sensitive     Inducible Clindamycin POSITIVE Resistant     * STAPHYLOCOCCUS AUREUS  Blood Culture ID Panel (Reflexed)     Status: Abnormal   Collection Time: 10/10/21 12:04 PM  Result Value Ref Range Status   Enterococcus faecalis NOT DETECTED NOT DETECTED Final   Enterococcus Faecium NOT DETECTED NOT DETECTED Final   Listeria monocytogenes NOT DETECTED NOT DETECTED Final   Staphylococcus species DETECTED (A) NOT DETECTED Final    Comment: CRITICAL RESULT CALLED TO, READ BACK BY AND VERIFIED WITH: BRANDON BEERS 10/11/21 1459 MU    Staphylococcus aureus (BCID) DETECTED (A) NOT DETECTED Final    Comment: CRITICAL RESULT CALLED TO, READ BACK BY AND VERIFIED WITH: BRANDON BEERS 10/11/21 1459 MU     Staphylococcus epidermidis NOT DETECTED NOT DETECTED Final   Staphylococcus lugdunensis NOT DETECTED NOT DETECTED Final   Streptococcus species NOT DETECTED NOT DETECTED Final   Streptococcus agalactiae NOT DETECTED NOT DETECTED Final   Streptococcus pneumoniae NOT DETECTED NOT DETECTED Final   Streptococcus pyogenes NOT DETECTED NOT DETECTED Final   A.calcoaceticus-baumannii NOT DETECTED NOT DETECTED Final   Bacteroides fragilis NOT DETECTED NOT DETECTED Final   Enterobacterales NOT DETECTED NOT DETECTED Final   Enterobacter cloacae complex NOT DETECTED NOT DETECTED Final   Escherichia coli NOT DETECTED NOT DETECTED Final  Klebsiella aerogenes NOT DETECTED NOT DETECTED Final   Klebsiella oxytoca NOT DETECTED NOT DETECTED Final   Klebsiella pneumoniae NOT DETECTED NOT DETECTED Final   Proteus species NOT DETECTED NOT DETECTED Final   Salmonella species NOT DETECTED NOT DETECTED Final   Serratia marcescens NOT DETECTED NOT DETECTED Final   Haemophilus influenzae NOT DETECTED NOT DETECTED Final   Neisseria meningitidis NOT DETECTED NOT DETECTED Final   Pseudomonas aeruginosa NOT DETECTED NOT DETECTED Final   Stenotrophomonas maltophilia NOT DETECTED NOT DETECTED Final   Candida albicans NOT DETECTED NOT DETECTED Final   Candida auris NOT DETECTED NOT DETECTED Final   Candida glabrata NOT DETECTED NOT DETECTED Final   Candida krusei NOT DETECTED NOT DETECTED Final   Candida parapsilosis NOT DETECTED NOT DETECTED Final   Candida tropicalis NOT DETECTED NOT DETECTED Final   Cryptococcus neoformans/gattii NOT DETECTED NOT DETECTED Final   Meth resistant mecA/C and MREJ NOT DETECTED NOT DETECTED Final    Comment: Performed at [facility], [address] [place], [place] [address]  Blood culture (routine x 2)     Status: None   Collection Time: 10/11/21  1:01 AM   Specimen: BLOOD  Result Value Ref Range Status   Specimen Description BLOOD LEFT HAND  Final   Special Requests    Final    BOTTLES DRAWN AEROBIC AND ANAEROBIC Blood Culture adequate volume   Culture   Final    NO GROWTH 5 DAYS Performed at [facility], [address] [place], [place] [address]    Report Status 10/16/2021 FINAL  Final  CULTURE, BLOOD (ROUTINE X 2) w Reflex to ID Panel     Status: None (Preliminary result)   Collection Time: 10/13/21  4:40 AM   Specimen: BLOOD  Result Value Ref Range Status   Specimen Description BLOOD BLOOD RIGHT HAND  Final   Special Requests   Final    BOTTLES DRAWN AEROBIC AND ANAEROBIC Blood Culture adequate volume   Culture   Final    NO GROWTH 3 DAYS Performed at [facility], [address] [place], [place] [address]    Report Status PENDING  Incomplete  CULTURE, BLOOD (ROUTINE X 2) w Reflex to ID Panel     Status: None (Preliminary result)   Collection Time: 10/13/21  4:51 AM   Specimen: BLOOD  Result Value Ref Range Status   Specimen Description BLOOD BLOOD LEFT HAND  Final   Special Requests   Final    BOTTLES DRAWN AEROBIC AND ANAEROBIC Blood Culture adequate volume   Culture   Final    NO GROWTH 3 DAYS Performed at [facility], [address] [place], [place] [address]    Report Status PENDING  Incomplete  Aerobic/Anaerobic Culture w Gram Stain (surgical/deep wound)     Status: None (Preliminary result)   Collection Time: 10/14/21  1:15 PM   Specimen: Foot; Body Fluid  Result Value Ref Range Status   Specimen Description   Final    FOOT Performed at [facility], [address] [place], [place] [address]    Special Requests   Final    NONE FOOT RIGHT Performed at [facility], [address] [place], [place] [address]    Gram Stain   Final    NO SQUAMOUS EPITHELIAL CELLS SEEN RARE WBC SEEN NO ORGANISMS SEEN    Culture   Final    RARE STAPHYLOCOCCUS AUREUS SUSCEPTIBILITIES TO FOLLOW Performed at [facility], [address] [place], [place] [address]    Report Status  PENDING  Incomplete    Procedures/Studies: MR FOOT RIGHT WO CONTRAST  Result Date: 10/10/2021 CLINICAL DATA:  Right foot pain and swelling, erythema EXAM: MRI OF THE RIGHT FOREFOOT WITHOUT CONTRAST TECHNIQUE: Multiplanar, multisequence MR imaging of the right was performed. No intravenous contrast was administered. COMPARISON:  10/10/2021 FINDINGS: Bones/Joint/Cartilage There is abnormal T2 signal within the distal aspect of the first metatarsal, with cortical irregularity along the plantar aspect of the metatarsal head. Findings are suspicious for osteomyelitis. No other acute or destructive bony lesions. There is prominent joint space narrowing of the first metatarsophalangeal joint, with moderate joint effusion identified. Hammertoe deformities are noted. Ligaments No acute findings. Muscles and Tendons No acute findings. Soft tissues There is diffuse subcutaneous edema throughout the right foot and visualized portions the ankle. No fluid collection or evidence of abscess. IMPRESSION: 1. Cortical irregularity plantar aspect distal margin first metatarsal, with underlying abnormal T2 signal. Given clinical history, favor osteomyelitis over occult fracture. 2. Moderate joint effusion of the first metatarsophalangeal joint. 3. Diffuse subcutaneous edema. Electronically Signed   By: Randa Ngo M.D.   On: 10/10/2021 22:33   US Venous Img Lower Bilateral (DVT)  Result Date: 10/11/2021 CLINICAL DATA:  Pain and swelling EXAM: BILATERAL LOWER EXTREMITY VENOUS DOPPLER ULTRASOUND TECHNIQUE: Gray-scale sonography with compression, as well as color and duplex ultrasound, were performed to evaluate the deep venous system(s) from the level of the common femoral vein through the popliteal and proximal calf veins. COMPARISON:  None. FINDINGS: VENOUS Normal compressibility of the common femoral, superficial femoral, and popliteal veins, as well as the visualized calf veins. Visualized portions of profunda femoral  vein and great saphenous vein unremarkable. No filling defects to suggest DVT on grayscale or color Doppler imaging. Doppler waveforms show normal direction of venous flow, normal respiratory plasticity and response to augmentation. OTHER 4.3 x 1.8 x 5.0 cm right popliteal fluid collection is most likely a Baker's cyst. 6.0 x 3.4 x 1.4 cm fluid collection in the left popliteal fossa also favored to be a Baker cyst. Limitations: none IMPRESSION: Bilateral lower extremity swelling. Electronically Signed   By: Miachel Roux M.D.   On: 10/11/2021 08:43   US ARTERIAL ABI (SCREENING LOWER EXTREMITY)  Result Date: 10/13/2021 CLINICAL DATA:  Pain. EXAM: NONINVASIVE PHYSIOLOGIC VASCULAR STUDY OF BILATERAL LOWER EXTREMITIES TECHNIQUE: Evaluation of both lower extremities were performed at rest, including calculation of ankle-brachial indices with single level Doppler, pressure and pulse volume recording. COMPARISON:  None. FINDINGS: Right ABI:  0.85 Left ABI:  0.92 Right Lower Extremity:  Irregular waveforms in the right ankle. Left Lower Extremity:  Primarily monophasic waveforms. IMPRESSION: 1. Resting right ABI is 0.85 and compatible with mild peripheral arterial disease. 2. Resting left ABI is 0.92 and compatible with borderline peripheral arterial disease. Electronically Signed   By: Markus Daft M.D.   On: 10/13/2021 08:22   DG Chest Port 1 View  Result Date: 10/10/2021 CLINICAL DATA:  Foot infection.  Questionable sepsis. EXAM: PORTABLE CHEST 1 VIEW COMPARISON:  None. FINDINGS: Heart is enlarged. Atherosclerotic changes are present at the aortic arch. Lung volumes are low. Mild interstitial coarsening appears chronic. No focal airspace disease present. Degenerative changes are present at the shoulders. IMPRESSION: 1. Cardiomegaly without failure. 2. Low lung volumes. 3. No acute cardiopulmonary disease. Electronically Signed   By: San Morelle M.D.   On: 10/10/2021 16:00   DG Foot Complete  Right  Result  Date: 10/11/2021 Please see detailed radiograph report in office note.  DG Foot Complete Right  Result Date: 10/10/2021 CLINICAL DATA:  Right foot pain with swelling and redness. Question infection. EXAM: RIGHT FOOT COMPLETE - 3+ VIEW COMPARISON:  Right foot radiographs same date at 1119 hours from [facility] in [place]. FINDINGS: 1452 hours. The bones appear diffusely demineralized. There is no evidence of acute fracture or dislocation. There is spurring of the base of the 5th metatarsal and calcaneal tuberosity. No bone destruction identified to suggest osteomyelitis. Diffuse soft tissue swelling, especially dorsally. No evidence of foreign body or soft tissue emphysema. Mild degenerative changes at the 1st MTP joint. IMPRESSION: Nonspecific forefoot soft tissue swelling and osteopenia. No radiographic evidence of osteomyelitis. Electronically Signed   By: Richardean Sale M.D.   On: 10/10/2021 15:27   ECHOCARDIOGRAM COMPLETE  Result Date: 10/14/2021    ECHOCARDIOGRAM REPORT   Patient Name:   Latoya Jacobs Date of Exam: 10/14/2021 Medical Rec #:  [id number]                Height:       67.0 in Accession #:    [id number]               Weight:       180.0 lb Date of Birth:  1929-06-03                BSA:          1.934 m Patient Age:    67 years                 BP:           145/59 mmHg Patient Gender: F                        HR:           73 bpm. Exam Location:  [facility] Procedure: 2D Echo, Cardiac Doppler and Color Doppler Indications:     Bacteremia R78.81  History:         Patient has no prior history of Echocardiogram examinations.                  Risk Factors:Hypertension.  Sonographer:     Sherrie Sport Referring Phys:  [id number] Tsosie Billing Diagnosing Phys: [email or web address]  1. Left ventricular ejection fraction, by estimation, is 60 to 65%. The left ventricle has normal function. The left ventricle has no regional wall motion abnormalities. There is mild  left ventricular hypertrophy. Left ventricular diastolic parameters are indeterminate.  2. Right ventricular systolic function was not well visualized. The right ventricular size is not well visualized. There is moderately elevated pulmonary artery systolic pressure.  3. Left atrial size was mildly dilated.  4. Right atrial size was mildly dilated.  5. The mitral valve is degenerative. Mild mitral valve regurgitation. No evidence of mitral stenosis.  6. Tricuspid valve regurgitation is moderate.  7. The aortic valve is grossly normal. Aortic valve regurgitation is mild to moderate. Aortic valve sclerosis is present, with no evidence of aortic valve stenosis. Conclusion(s)/Recommendation(s): No evidence of valvular vegetations on this transthoracic echocardiogram. Consider a transesophageal echocardiogram to exclude infective endocarditis if clinically indicated. FINDINGS  Left Ventricle: Left ventricular ejection fraction, by estimation, is 60 to 65%. The left ventricle has normal function. The left ventricle has no regional wall motion abnormalities. The left ventricular internal cavity size was normal in size.  There is  mild left ventricular hypertrophy. Left ventricular diastolic parameters are indeterminate. Right Ventricle: The right ventricular size is not well visualized. Right vetricular wall thickness was not well visualized. Right ventricular systolic function was not well visualized. There is moderately elevated pulmonary artery systolic pressure. Left Atrium: Left atrial size was mildly dilated. Right Atrium: Right atrial size was mildly dilated. Pericardium: There is no evidence of pericardial effusion. Mitral Valve: The mitral valve is degenerative in appearance. Mild mitral valve regurgitation. No evidence of mitral valve stenosis. MV peak gradient, 9.7 mmHg. The mean mitral valve gradient is 3.0 mmHg. Tricuspid Valve: The tricuspid valve is not well visualized. Tricuspid valve regurgitation is  moderate . No evidence of tricuspid stenosis. Aortic Valve: The aortic valve is grossly normal. Aortic valve regurgitation is mild to moderate. Aortic regurgitation PHT measures 485 msec. Aortic valve sclerosis is present, with no evidence of aortic valve stenosis. Aortic valve mean gradient measures 7.7 mmHg. Aortic valve peak gradient measures 14.4 mmHg. Aortic valve area, by VTI measures 1.55 cm. Pulmonic Valve: The pulmonic valve was not well visualized. Pulmonic valve regurgitation is not visualized. No evidence of pulmonic stenosis. Aorta: The aortic root is normal in size and structure. Venous: The inferior vena cava was not well visualized. IAS/Shunts: No atrial level shunt detected by color flow Doppler.  LEFT VENTRICLE PLAX 2D LVIDd:         4.30 cm LVIDs:         2.80 cm LV PW:         1.30 cm LV IVS:        1.00 cm LVOT diam:     2.00 cm LV SV:         67 LV SV Index:   35 LVOT Area:     3.14 cm  RIGHT VENTRICLE RV Basal diam:  3.80 cm RV S prime:     8.49 cm/s TAPSE (M-mode): 3.6 cm LEFT ATRIUM           Index        RIGHT ATRIUM           Index LA diam:      5.10 cm 2.64 cm/m   RA Area:     24.10 cm LA Vol (A2C): 70.6 ml 36.51 ml/m  RA Volume:   73.10 ml  37.80 ml/m LA Vol (A4C): 72.3 ml 37.39 ml/m  AORTIC VALVE                     PULMONIC VALVE AV Area (Vmax):    1.53 cm      PV Vmax:        0.60 m/s AV Area (Vmean):   1.52 cm      PV Vmean:       43.900 cm/s AV Area (VTI):     1.55 cm      PV VTI:         0.104 m AV Vmax:           190.00 cm/s   PV Peak grad:   1.4 mmHg AV Vmean:          129.667 cm/s  PV Mean grad:   1.0 mmHg AV VTI:            0.433 m       RVOT Peak grad: 1 mmHg AV Peak Grad:      14.4 mmHg AV Mean Grad:      7.7 mmHg LVOT Vmax:  92.70 cm/s LVOT Vmean:        62.600 cm/s LVOT VTI:          0.214 m LVOT/AV VTI ratio: 0.49 AI PHT:            485 msec  AORTA Ao Root diam: 2.60 cm MITRAL VALVE                TRICUSPID VALVE MV Area (PHT): 4.49 cm     TR Peak grad:    56.2 mmHg MV Area VTI:   2.64 cm     TR Vmax:        375.00 cm/s MV Peak grad:  9.7 mmHg MV Mean grad:  3.0 mmHg     SHUNTS MV Vmax:       1.56 m/s     Systemic VTI:  0.21 m MV Vmean:      80.6 cm/s    Systemic Diam: 2.00 cm MV Decel Time: 169 msec     Pulmonic VTI:  0.090 m MV E velocity: 114.00 cm/s Donnelly Angelica Electronically signed by Donnelly Angelica Signature Date/Time: 10/14/2021/12:48:48 PM    Final    [place] EKG SITE RITE  Result Date: 10/15/2021 If Site Rite image not attached, placement could not be confirmed due to current cardiac rhythm.   Labs: BNP (last 3 results) No results for input(s): BNP in the last 8760 hours. Basic Metabolic Panel: Recent Labs  Lab 10/10/21 1204 10/11/21 0517 10/12/21 0419 10/15/21 0804 10/16/21 0400  NA 136 141 141 139 140  K 3.5 3.3* 4.0 3.3* 3.3*  CL 98 103 103 99 100  CO2 27 28 29 30 30   GLUCOSE 218* 127* 157* 121* 103*  BUN 28* 28* 26* 22 23  CREATININE 1.12* 0.94 0.94 0.90 1.00  CALCIUM 8.8* 8.7* 9.0 8.4* 8.5*  MG  --   --  1.9 1.8  --    Liver Function Tests: Recent Labs  Lab 10/10/21 1204  AST 20  ALT 10  ALKPHOS 168*  BILITOT 0.9  PROT 7.9  ALBUMIN 3.1*   No results for input(s): LIPASE, AMYLASE in the last 168 hours. No results for input(s): AMMONIA in the last 168 hours. CBC: Recent Labs  Lab 10/10/21 1204 10/11/21 0517 10/12/21 0419  WBC 10.8* 9.4 9.7  NEUTROABS 9.2*  --   --   HGB 12.0 10.7* 10.7*  HCT 37.6 33.5* 34.2*  MCV 92.4 91.5 93.7  PLT 253 245 243   Cardiac Enzymes: No results for input(s): CKTOTAL, CKMB, CKMBINDEX, TROPONINI in the last 168 hours. BNP: Invalid input(s): POCBNP CBG: No results for input(s): GLUCAP in the last 168 hours. D-Dimer No results for input(s): DDIMER in the last 72 hours. Hgb A1c No results for input(s): HGBA1C in the last 72 hours. Lipid Profile No results for input(s): CHOL, HDL, LDLCALC, TRIG, CHOLHDL, LDLDIRECT in the last 72 hours. Thyroid function studies Recent Labs     10/15/21 0804  TSH 3.530   Anemia work up No results for input(s): VITAMINB12, FOLATE, FERRITIN, TIBC, IRON, RETICCTPCT in the last 72 hours. Urinalysis No results found for: COLORURINE, APPEARANCEUR, [place], [place], [place], [place], [place], [place], PROTEINUR, UROBILINOGEN, NITRITE, LEUKOCYTESUR Sepsis Labs Invalid input(s): PROCALCITONIN,  WBC,  LACTICIDVEN Microbiology Recent Results (from the past 240 hour(s))  Blood culture (routine x 2)     Status: Abnormal   Collection Time: 10/10/21 12:04 PM   Specimen: BLOOD  Result Value Ref Range Status   Specimen Description   Final  BLOOD LEFT ANTECUBITAL Performed at [facility], [address] [place], [place] [address]    Special Requests   Final    BOTTLES DRAWN AEROBIC AND ANAEROBIC Blood Culture adequate volume Performed at [address] [place], [place] [address]    Culture  Setup Time   Final    GRAM POSITIVE COCCI ANAEROBIC BOTTLE ONLY CRITICAL RESULT CALLED TO, READ BACK BY AND VERIFIED WITH: BRANDON BEERS 10/11/21 1459 MU Performed at [facility], [place] [address], [place], [place] [address]    Culture STAPHYLOCOCCUS AUREUS (A)  Final   Report Status 10/14/2021 FINAL  Final   Organism ID, Bacteria STAPHYLOCOCCUS AUREUS  Final      Susceptibility   Staphylococcus aureus - MIC*    CIPROFLOXACIN >=8 RESISTANT Resistant     ERYTHROMYCIN RESISTANT Resistant     GENTAMICIN <=0.5 SENSITIVE Sensitive     OXACILLIN <=0.25 SENSITIVE Sensitive     TETRACYCLINE <=1 SENSITIVE Sensitive     VANCOMYCIN <=0.5 SENSITIVE Sensitive     TRIMETH/SULFA <=10 SENSITIVE Sensitive     CLINDAMYCIN RESISTANT Resistant     RIFAMPIN <=0.5 SENSITIVE Sensitive     Inducible Clindamycin POSITIVE Resistant     * STAPHYLOCOCCUS AUREUS  Blood Culture ID Panel (Reflexed)     Status: Abnormal   Collection Time: 10/10/21 12:04 PM  Result Value Ref Range Status   Enterococcus faecalis NOT DETECTED  NOT DETECTED Final   Enterococcus Faecium NOT DETECTED NOT DETECTED Final   Listeria monocytogenes NOT DETECTED NOT DETECTED Final   Staphylococcus species DETECTED (A) NOT DETECTED Final    Comment: CRITICAL RESULT CALLED TO, READ BACK BY AND VERIFIED WITH: BRANDON BEERS 10/11/21 1459 MU    Staphylococcus aureus (BCID) DETECTED (A) NOT DETECTED Final    Comment: CRITICAL RESULT CALLED TO, READ BACK BY AND VERIFIED WITH: BRANDON BEERS 10/11/21 1459 MU    Staphylococcus epidermidis NOT DETECTED NOT DETECTED Final   Staphylococcus lugdunensis NOT DETECTED NOT DETECTED Final   Streptococcus species NOT DETECTED NOT DETECTED Final   Streptococcus agalactiae NOT DETECTED NOT DETECTED Final   Streptococcus pneumoniae NOT DETECTED NOT DETECTED Final   Streptococcus pyogenes NOT DETECTED NOT DETECTED Final   A.calcoaceticus-baumannii NOT DETECTED NOT DETECTED Final   Bacteroides fragilis NOT DETECTED NOT DETECTED Final   Enterobacterales NOT DETECTED NOT DETECTED Final   Enterobacter cloacae complex NOT DETECTED NOT DETECTED Final   Escherichia coli NOT DETECTED NOT DETECTED Final   Klebsiella aerogenes NOT DETECTED NOT DETECTED Final   Klebsiella oxytoca NOT DETECTED NOT DETECTED Final   Klebsiella pneumoniae NOT DETECTED NOT DETECTED Final   Proteus species NOT DETECTED NOT DETECTED Final   Salmonella species NOT DETECTED NOT DETECTED Final   Serratia marcescens NOT DETECTED NOT DETECTED Final   Haemophilus influenzae NOT DETECTED NOT DETECTED Final   Neisseria meningitidis NOT DETECTED NOT DETECTED Final   Pseudomonas aeruginosa NOT DETECTED NOT DETECTED Final   Stenotrophomonas maltophilia NOT DETECTED NOT DETECTED Final   Candida albicans NOT DETECTED NOT DETECTED Final   Candida auris NOT DETECTED NOT DETECTED Final   Candida glabrata NOT DETECTED NOT DETECTED Final   Candida krusei NOT DETECTED NOT DETECTED Final   Candida parapsilosis NOT DETECTED NOT DETECTED Final   Candida  tropicalis NOT DETECTED NOT DETECTED Final   Cryptococcus neoformans/gattii NOT DETECTED NOT DETECTED Final   Meth resistant mecA/C and MREJ NOT DETECTED NOT DETECTED Final    Comment: Performed at [facility]  Lab, [address] [place], [place] [address]  Blood culture (routine x 2)     Status: None   Collection Time: 10/11/21  1:01 AM   Specimen: BLOOD  Result Value Ref Range Status   Specimen Description BLOOD LEFT HAND  Final   Special Requests   Final    BOTTLES DRAWN AEROBIC AND ANAEROBIC Blood Culture adequate volume   Culture   Final    NO GROWTH 5 DAYS Performed at [facility], [address] [place], [place] [address]    Report Status 10/16/2021 FINAL  Final  CULTURE, BLOOD (ROUTINE X 2) w Reflex to ID Panel     Status: None (Preliminary result)   Collection Time: 10/13/21  4:40 AM   Specimen: BLOOD  Result Value Ref Range Status   Specimen Description BLOOD BLOOD RIGHT HAND  Final   Special Requests   Final    BOTTLES DRAWN AEROBIC AND ANAEROBIC Blood Culture adequate volume   Culture   Final    NO GROWTH 3 DAYS Performed at [facility], [address], [place], [place] [address]    Report Status PENDING  Incomplete  CULTURE, BLOOD (ROUTINE X 2) w Reflex to ID Panel     Status: None (Preliminary result)   Collection Time: 10/13/21  4:51 AM   Specimen: BLOOD  Result Value Ref Range Status   Specimen Description BLOOD BLOOD LEFT HAND  Final   Special Requests   Final    BOTTLES DRAWN AEROBIC AND ANAEROBIC Blood Culture adequate volume   Culture   Final    NO GROWTH 3 DAYS Performed at [facility], [address] [place], [place] [address]    Report Status PENDING  Incomplete  Aerobic/Anaerobic Culture w Gram Stain (surgical/deep wound)     Status: None (Preliminary result)   Collection Time: 10/14/21  1:15 PM   Specimen: Foot; Body Fluid  Result Value Ref Range Status   Specimen Description   Final    FOOT Performed at  [facility], [address], [place],  [address]    Special Requests   Final    NONE FOOT RIGHT Performed at [facility], [address] [place], [place] [address]    Gram Stain   Final    NO SQUAMOUS EPITHELIAL CELLS SEEN RARE WBC SEEN NO ORGANISMS SEEN    Culture   Final    RARE STAPHYLOCOCCUS AUREUS SUSCEPTIBILITIES TO FOLLOW Performed at [facility], [address] [place],  [address]    Report Status PENDING  Incomplete     Time coordinating discharge: 35 minutes  SIGNED: Antonieta Pert, MD  [facility] 10/16/2021, 2:15 PM  If 7PM-7AM, please contact night-coverage [email or web address]

## 2021-10-16 NOTE — TOC Transition Note (Signed)
Transition of Care [facility]) - CM/SW Discharge Note   Patient Details  Name: Latoya Jacobs MRN: [id number] Date of Birth: [birth year]  Transition of Care [facility]) CM/SW Contact:  Candie Chroman, LCSW Phone Number: 10/16/2021, 3:09 PM   Clinical Narrative:   Patient has orders to discharge home today. Ordered walker to be delivered to the room before she leaves. Podiatry (Dr. Amalia Hailey) will cover home health orders. No further concerns. CSW signing off.  Final next level of care: [place] Barriers to Discharge: Barriers Resolved   Patient Goals and CMS Choice     Choice offered to / list presented to : NA  Discharge Placement                    Patient and family notified of of transfer: 10/16/21  Discharge Plan and Services     Post Acute Care Choice: [place], Durable Medical Equipment          DME Arranged: Gilford Rile rolling DME Agency: [employer] Date DME Agency Contacted: 10/16/21   Representative spoke with at DME Agency: Freda Munro HH Arranged: RN, PT, OT [facility] Agency: [place] Date [place]: 10/16/21   Representative spoke with at [place]: [place] ([place]) Interventions     Readmission Risk Interventions No flowsheet data found.

## 2021-10-16 NOTE — Progress Notes (Signed)
PHARMACY CONSULT NOTE FOR:  OUTPATIENT  PARENTERAL ANTIBIOTIC THERAPY (OPAT)  Indication: MSSA bacteremia with cellulitis and septic arthritis of great toe Regimen: dalbavancin 1500mg  IV x 1 on 1/5 then 500mg  IV weekly x 3 doses (total 4 doses) End date: 11/07/2021  - patient to also receive cephalexin 500mg  PO QID x 14 days  Patient to receive dalbavancin at same day surgery.  First appointment scheduled for 9am tomorrow, 10/17/2021.  Patient and daughter, Sharyn Lull, are aware and agreeable to this time.  Same day surgery has placed outpatient encounter.  Above orders for dalbavancin have been pended to be released upon arrival.  Labs have also been pended for RN to release starting 10/24/2021  Thank you for allowing pharmacy to be a part of this patient's care.  Doreene Eland, PharmD, BCPS, BCIDP Work Cell: [phone] 10/16/2021 2:33 PM

## 2021-10-17 ENCOUNTER — Telehealth: Payer: Self-pay

## 2021-10-17 ENCOUNTER — Ambulatory Visit
Admit: 2021-10-17 | Discharge: 2021-10-17 | Disposition: A | Discharge status: Home / Self Care | Payer: Medicare Other | Attending: Infectious Diseases | Admitting: Infectious Diseases

## 2021-10-17 NOTE — Telephone Encounter (Signed)
Patient did not make her 9 am at [facility] Day Surgery [facility]. Need to call daughter Latoya Jacobs and ask if still wants to do the Infusion Dalbavancin. If patient has decided not to do this that is fine but if she would like to reschedule her mother to go she needs to call [phone] and ask for Same Day Surgery Scheduling. Let Dr Delaine Lame know what the final decision.    Left VM with my contact info today at [place]. Will keep trying.

## 2021-10-18 ENCOUNTER — Other Ambulatory Visit (HOSPITAL_COMMUNITY): Payer: Self-pay

## 2021-10-18 LAB — CULTURE, BLOOD (ROUTINE X 2)
Culture: NO GROWTH
Culture: NO GROWTH
Special Requests: ADEQUATE
Special Requests: ADEQUATE

## 2021-10-20 LAB — AEROBIC/ANAEROBIC CULTURE W GRAM STAIN (SURGICAL/DEEP WOUND): Gram Stain: NONE SEEN

## 2021-10-21 ENCOUNTER — Telehealth: Payer: Self-pay | Admitting: *Deleted

## 2021-10-21 NOTE — Telephone Encounter (Signed)
Called and gave verbal orders for the nurse (Sherri)per Dr Margurite Auerbach weekly for 9 weeks, to monitor vital signs until patient goes home. She verbalized understanding,will get orders started

## 2021-10-21 NOTE — Telephone Encounter (Signed)
Please approve orders

## 2021-10-21 NOTE — Telephone Encounter (Signed)
Sherri, nurse w/ [facility] is calling for home health nursing orders to access the area of right lower leg(cellulitis)once weekly for 9 weeks,to monitor vital signs until patient goes home. Please advise.

## 2021-10-31 ENCOUNTER — Other Ambulatory Visit: Payer: Self-pay

## 2021-10-31 ENCOUNTER — Other Ambulatory Visit
Admission: RE | Admit: 2021-10-31 | Discharge: 2021-10-31 | Disposition: A | Discharge status: Home / Self Care | Payer: Medicare Other | Attending: Infectious Diseases | Admitting: Infectious Diseases

## 2021-10-31 ENCOUNTER — Ambulatory Visit: Payer: Medicare Other | Attending: Infectious Diseases | Admitting: Infectious Diseases

## 2021-10-31 VITALS — BP 154/76 | HR 69 | Resp 16 | Ht 67.0 in | Wt 180.0 lb

## 2021-10-31 DIAGNOSIS — E039 Hypothyroidism, unspecified: Secondary | ICD-10-CM | POA: Insufficient documentation

## 2021-10-31 DIAGNOSIS — Z923 Personal history of irradiation: Secondary | ICD-10-CM | POA: Diagnosis not present

## 2021-10-31 DIAGNOSIS — R7881 Bacteremia: Secondary | ICD-10-CM | POA: Diagnosis present

## 2021-10-31 DIAGNOSIS — I10 Essential (primary) hypertension: Secondary | ICD-10-CM | POA: Diagnosis not present

## 2021-10-31 DIAGNOSIS — Z79899 Other long term (current) drug therapy: Secondary | ICD-10-CM | POA: Diagnosis not present

## 2021-10-31 DIAGNOSIS — D649 Anemia, unspecified: Secondary | ICD-10-CM | POA: Diagnosis not present

## 2021-10-31 DIAGNOSIS — Z8542 Personal history of malignant neoplasm of other parts of uterus: Secondary | ICD-10-CM | POA: Diagnosis not present

## 2021-10-31 DIAGNOSIS — I4891 Unspecified atrial fibrillation: Secondary | ICD-10-CM | POA: Insufficient documentation

## 2021-10-31 DIAGNOSIS — Z792 Long term (current) use of antibiotics: Secondary | ICD-10-CM | POA: Insufficient documentation

## 2021-10-31 DIAGNOSIS — Z79890 Hormone replacement therapy (postmenopausal): Secondary | ICD-10-CM | POA: Insufficient documentation

## 2021-10-31 DIAGNOSIS — B9561 Methicillin susceptible Staphylococcus aureus infection as the cause of diseases classified elsewhere: Secondary | ICD-10-CM | POA: Diagnosis present

## 2021-10-31 DIAGNOSIS — M00071 Staphylococcal arthritis, right ankle and foot: Secondary | ICD-10-CM | POA: Diagnosis present

## 2021-10-31 DIAGNOSIS — Z8544 Personal history of malignant neoplasm of other female genital organs: Secondary | ICD-10-CM

## 2021-10-31 LAB — CBC WITH DIFFERENTIAL/PLATELET
Abs Immature Granulocytes: 0.05 10*3/uL (ref 0.00–0.07)
Basophils Absolute: 0.1 10*3/uL (ref 0.0–0.1)
Basophils Relative: 1 %
Eosinophils Absolute: 0.1 10*3/uL (ref 0.0–0.5)
Eosinophils Relative: 1 %
HCT: 39.2 % (ref 36.0–46.0)
Hemoglobin: 12.5 g/dL (ref 12.0–15.0)
Immature Granulocytes: 1 %
Lymphocytes Relative: 8 %
Lymphs Abs: 0.8 10*3/uL (ref 0.7–4.0)
MCH: 28.9 pg (ref 26.0–34.0)
MCHC: 31.9 g/dL (ref 30.0–36.0)
MCV: 90.7 fL (ref 80.0–100.0)
Monocytes Absolute: 0.8 10*3/uL (ref 0.1–1.0)
Monocytes Relative: 7 %
Neutro Abs: 8.5 10*3/uL — ABNORMAL HIGH (ref 1.7–7.7)
Neutrophils Relative %: 82 %
Platelets: 238 10*3/uL (ref 150–400)
RBC: 4.32 MIL/uL (ref 3.87–5.11)
RDW: 15.6 % — ABNORMAL HIGH (ref 11.5–15.5)
WBC: 10.4 10*3/uL (ref 4.0–10.5)
nRBC: 0 % (ref 0.0–0.2)

## 2021-10-31 LAB — COMPREHENSIVE METABOLIC PANEL
ALT: 9 U/L (ref 0–44)
AST: 19 U/L (ref 15–41)
Albumin: 3.4 g/dL — ABNORMAL LOW (ref 3.5–5.0)
Alkaline Phosphatase: 140 U/L — ABNORMAL HIGH (ref 38–126)
Anion gap: 11 (ref 5–15)
BUN: 32 mg/dL — ABNORMAL HIGH (ref 8–23)
CO2: 32 mmol/L (ref 22–32)
Calcium: 9.2 mg/dL (ref 8.9–10.3)
Chloride: 93 mmol/L — ABNORMAL LOW (ref 98–111)
Creatinine, Ser: 1.07 mg/dL — ABNORMAL HIGH (ref 0.44–1.00)
GFR, Estimated: 49 mL/min — ABNORMAL LOW (ref >60)
Glucose, Bld: 150 mg/dL — ABNORMAL HIGH (ref 70–99)
Potassium: 3.7 mmol/L (ref 3.5–5.1)
Sodium: 136 mmol/L (ref 135–145)
Total Bilirubin: 1 mg/dL (ref 0.3–1.2)
Total Protein: 8.3 g/dL — ABNORMAL HIGH (ref 6.5–8.1)

## 2021-10-31 LAB — SEDIMENTATION RATE: Sed Rate: 65 mm/hr — ABNORMAL HIGH (ref 0–30)

## 2021-10-31 LAB — URIC ACID: Uric Acid, Serum: 8.4 mg/dL — ABNORMAL HIGH (ref 2.5–7.1)

## 2021-10-31 LAB — C-REACTIVE PROTEIN: CRP: 5.3 mg/dL — ABNORMAL HIGH (ref <1.0)

## 2021-10-31 MED ORDER — CEPHALEXIN 500 MG PO CAPS: 500.0000 mg | ORAL_CAPSULE | Freq: Four times a day (QID) | ORAL | 0 refills | Status: DC

## 2021-10-31 NOTE — Patient Instructions (Addendum)
You are here for the rt great toe infection with staphylococcus and bacteremia due to the same- You did not want to take any IV antibiotic and hence you have been taking keflex 520m PO q6. Will continue for 1 more week to complete 4 weeks to treat the septic arthritis of the rt great toe and cellulitis. Will do labs today ( CBC/CMP/ESR/CRP/Uric acid)

## 2021-10-31 NOTE — Progress Notes (Signed)
NAME: Latoya Jacobs  DOB: 01-Jul-1929  MRN: [id number]  Date/Time: 10/31/2021 9:57 AM  Subjective:   Follow-up after recent hospitalization Patient is here with her daughter. Latoya Jacobs is a 88 y.o. female with a history of of hypertension, hypothyroidism, endometrial carcinoma, status post radiation in 2021 Patient was hospitalized at [facility] between 10/10/2021 until 10/16/2021 with swelling and pain of the right foot and found to have infection of the right great toe causing Staph aureus bacteremia The great toe was aspirated and there was Staphylococcus aureus in the synovial fluid as well.  There were no uric acid crystals seen. Her uric acid was elevated. There was a concern  that she had underlying gout with superadded infection Patient on discharge was asked to get weekly IV dalbavancin into 4 weeks and p.o. cephalexin.  Appointment was made for the day surgery center.  But patient did not keep the appointment as she did not want to get IV infusion. She has been on oral antibiotics cephalexin 5 mg p.o. every 6 for the past 2 weeks. She is currently living with her daughter in [place].  She is planning to go back to [place] to her home She states the foot is doing much better The swelling is reduced There is no pain She has not seen podiatrist as outpatient yet   Past Medical History:  Diagnosis Date   Dysrhythmia    Hypertension    Hypothyroidism     Past Surgical History:  Procedure Laterality Date   APPENDECTOMY      Social History   Socioeconomic History   Marital status: Married    Spouse name: Not on file   Number of children: Not on file   Years of education: Not on file   Highest education level: Not on file  Occupational History   Not on file  Tobacco Use   Smoking status: Former    Types: Cigarettes   Smokeless tobacco: Never  Substance and Sexual Activity   Alcohol use: Never   Drug use: Not on file   Sexual activity: Not on file   Other Topics Concern   Not on file  Social History Narrative   Not on file   Social Determinants of Health   Financial Resource Strain: Not on file  Food Insecurity: Not on file  Transportation Needs: Not on file  Physical Activity: Not on file  Stress: Not on file  Social Connections: Not on file  Intimate Partner Violence: Not on file    Family History  Problem Relation Age of Onset   Diabetes Mellitus II Father    No Known Allergies I? Current Outpatient Medications  Medication Sig Dispense Refill   acetaminophen (TYLENOL) 325 MG suppository Place 325 mg rectally every 4 (four) hours as needed.     acetaminophen (TYLENOL) 325 MG tablet Take 650 mg by mouth every 4 (four) hours as needed.     bumetanide (BUMEX) 1 MG tablet Take 1 mg by mouth daily.     cholecalciferol (VITAMIN D) 25 MCG (1000 UNIT) tablet Take 1,000 Units by mouth daily.     diclofenac Sodium (VOLTAREN) 1 % GEL Apply 1 application topically 4 (four) times daily.     fluticasone (FLONASE) 50 MCG/ACT nasal spray Place 1 spray into both nostrils 2 (two) times daily.     levothyroxine (SYNTHROID) 100 MCG tablet Take 100 mcg by mouth daily before breakfast.     propranolol ER (INDERAL LA) 120 MG 24 hr capsule Take 120  mg by mouth daily.     No current facility-administered medications for this visit.     Abtx:  Anti-infectives (From admission, onward)    None       REVIEW OF SYSTEMS:  Const: negative fever, negative chills, negative weight loss Eyes: negative diplopia or visual changes, negative eye pain ENT: negative coryza, negative sore throat Resp: negative cough, hemoptysis, dyspnea Cards: negative for chest pain, palpitations, lower extremity edema GU: negative for frequency, dysuria and hematuria GI: Negative for abdominal pain, diarrhea, bleeding, constipation Skin: negative for rash and pruritus Heme: negative for easy bruising and gum/nose bleeding MS: negative for myalgias, arthralgias,  back pain and muscle weakness Neurolo:negative for headaches, dizziness, vertigo, memory problems  Psych: negative for feelings of anxiety, depression  Endocrine: negative for thyroid, diabetes Allergy/Immunology-no known allergies.  She ambulates with walker. Objective:  VITALS:  BP (!) 154/76    Pulse 69    Resp 16    Ht 5\' 7"  (1.702 m)    Wt 180 lb (81.6 kg)    LMP  (LMP Unknown)    SpO2 95%    BMI 28.19 kg/m  PHYSICAL EXAM:  General: Alert, cooperative, no distress, appears stated age.  Today she is in wheelchair. Head: Normocephalic, without obvious abnormality, atraumatic. Eyes: Conjunctivae clear, anicteric sclerae. Pupils are equal ENT Nares normal. No drainage or sinus tenderness. Lips, mucosa, and tongue normal. No Thrush Neck: , symmetrical, no adenopathy, thyroid: non tender no carotid bruit and no JVD. Back: No CVA tenderness. Lungs: Clear to auscultation bilaterally. No Wheezing or Rhonchi. No rales. Heart: Irregular well controlled. Abdomen: Did not examine because of wheelchair. Extremities: Right foot swelling and erythema much improved Right great toe swelling and erythema is also reduced Movement is painless 10/31/21   121/29/22   Skin: No rashes or lesions. Or bruising Lymph: Cervical, supraclavicular normal. Neurologic: Grossly non-focal  ? Impression/Recommendation MSSA bacteremia.  Source was the right foot.  Patient got cefazolin while in the hospital for 7 days.  2D echo did not show any vegetation.  Repeat blood cultures from 10/13/2021 was negative.  Patient was not interested in TEE.  The right great toe MTP joint was aspirated and that had MSSA.  No urate crystals were seen.  The concern was for septic arthritis.  Patient did not want to get a PICC line or get IV antibiotics at home.  Arrangements were made for weekly dalbavancin at the day surgery center and also p.o. Keflex.  Patient did not go to the day surgery center as she did not want to get IV  antibiotics.  She has been on cephalexin 500 mg p.o. every 6 since discharge. Today we will do labs.  We will give 1 more week of Keflex.  History of hypothyroidism on Synthroid  A. fib rate well controlled on propanolol  History of endometrial CA status post radiation  Anemia ? Discussed the management with the patient and the daughter Also communicated with Dr. Sherryle Lis podiatrist.   ___________________________________________________ Labs resulted and she has creatinine 1.07 WBC 10.4 Platelet 238 Hb 12.5 Sed rate 55 Uric acid 8.3 CRP 5.3 Patient may need 2 more weeks of Keflex.  We will let the family know of the results . Note:  This document was prepared using Dragon voice recognition software and may include unintentional dictation errors.

## 2022-01-16 ENCOUNTER — Encounter: Payer: Self-pay | Admitting: Podiatry

## 2022-01-16 ENCOUNTER — Ambulatory Visit (INDEPENDENT_AMBULATORY_CARE_PROVIDER_SITE_OTHER): Payer: Medicare Other | Admitting: Podiatry

## 2022-01-16 DIAGNOSIS — L02611 Cutaneous abscess of right foot: Secondary | ICD-10-CM | POA: Diagnosis not present

## 2022-01-16 DIAGNOSIS — L03115 Cellulitis of right lower limb: Secondary | ICD-10-CM | POA: Diagnosis not present

## 2022-01-16 DIAGNOSIS — M79674 Pain in right toe(s): Secondary | ICD-10-CM | POA: Diagnosis not present

## 2022-01-16 DIAGNOSIS — B351 Tinea unguium: Secondary | ICD-10-CM

## 2022-01-16 DIAGNOSIS — N1831 Chronic kidney disease, stage 3a: Secondary | ICD-10-CM

## 2022-01-16 DIAGNOSIS — M79675 Pain in left toe(s): Secondary | ICD-10-CM

## 2022-01-16 MED ORDER — DOXYCYCLINE HYCLATE 100 MG PO TABS: 100.0000 mg | ORAL_TABLET | Freq: Two times a day (BID) | ORAL | 0 refills | Status: DC

## 2022-01-16 NOTE — Progress Notes (Signed)
This patient returns to my office for at risk foot care.  This patient requires this care by a professional since this patient will be at risk due to having kidney disease.  This patient is unable to cut nails himself since the patient cannot reach his nails.These nails are painful walking and wearing shoes. She presents  to the office with her 2 daughters.  Her daughters are concerned about a open wound between the 4/5 digits right foot.  Open wound is presents on the inside fifth toe right foot.  Patient has history of cellulitis which has resolved. This patient presents for at risk foot care today. ? ?General Appearance  Alert, conversant and in no acute stress. ? ?Vascular  Dorsalis pedis and posterior tibial  pulses are weakly palpable  bilaterally.  Capillary return is within normal limits  bilaterally. Temperature is within normal limits  bilaterally. ? ?Neurologic  Senn-Weinstein monofilament wire test within normal limits  bilaterally. Muscle power within normal limits bilaterally. ? ?Nails Thick disfigured discolored nails with subungual debris  from hallux to fifth toes bilaterally. No evidence of bacterial infection or drainage bilaterally. ? ?Orthopedic  No limitations of motion  feet .  No crepitus or effusions noted.  No bony pathology or digital deformities noted. ? ?Skin  normotropic skin with no porokeratosis noted bilaterally.  No signs of infections or ulcers noted.    ? ?Onychomycosis  Pain in right toes  Pain in left toes  Ulcer fifth toe right foot. ? ?Consent was obtained for treatment procedures.   Mechanical debridement of nails 1-5  bilaterally performed with a nail nipper.  Filed with dremel without incident. Dr.  Posey Pronto and consulted on the open wound right foot.  He recommended she take doxycycline for 14 days and wear toe separator. To see Dr.  Posey Pronto in 4 weeks.  RTC nail care 3 months. ? ? ?Return office visit    3 months                  Told patient to return for periodic foot care  and evaluation due to potential at risk complications. ? ? ?Gardiner Barefoot DPM   ?

## 2022-02-18 ENCOUNTER — Ambulatory Visit: Payer: Medicare Other | Admitting: Podiatry

## 2022-03-03 ENCOUNTER — Telehealth: Payer: Self-pay | Admitting: Podiatry

## 2022-03-03 NOTE — Telephone Encounter (Signed)
Patient daughter called her wound is not getting any better she would like an antibiotic cream to put on it. Patient has a UTI and cant come to her appt .

## 2022-03-03 NOTE — Telephone Encounter (Signed)
[facility] on [address] or [address] in [place] is the pharmacy she would like the medication sent to.. preferably not oral as pt is on a oral for uti/

## 2022-03-04 ENCOUNTER — Ambulatory Visit: Payer: Medicare Other | Admitting: Podiatry

## 2022-03-04 MED ORDER — MUPIROCIN 2 % EX OINT: 1.0000 "application " | TOPICAL_OINTMENT | Freq: Two times a day (BID) | CUTANEOUS | 0 refills | Status: DC

## 2022-03-20 ENCOUNTER — Ambulatory Visit: Payer: Medicare Other | Admitting: Podiatry

## 2022-03-25 ENCOUNTER — Encounter: Payer: Self-pay | Admitting: Podiatry

## 2022-03-25 ENCOUNTER — Ambulatory Visit (INDEPENDENT_AMBULATORY_CARE_PROVIDER_SITE_OTHER): Payer: Medicare Other | Admitting: Podiatry

## 2022-03-25 DIAGNOSIS — M79674 Pain in right toe(s): Secondary | ICD-10-CM | POA: Diagnosis not present

## 2022-03-25 DIAGNOSIS — M79675 Pain in left toe(s): Secondary | ICD-10-CM | POA: Diagnosis not present

## 2022-03-25 DIAGNOSIS — B351 Tinea unguium: Secondary | ICD-10-CM

## 2022-03-27 ENCOUNTER — Encounter: Payer: Self-pay | Admitting: Podiatry

## 2022-03-27 NOTE — Progress Notes (Signed)
  Subjective:  Patient ID: Latoya Jacobs, female    DOB: December 05, 1928,  MRN: [id number]  Chief Complaint  Patient presents with   Nail Problem   88 y.o. female returns for the above complaint.  Patient presents with thickened elongated dystrophic toenails x10.  Pain on palpation.  Patient not able to debride down herself.  She would like for me to do it.  She denies any other acute complaints.  Objective:  There were no vitals filed for this visit. Podiatric Exam: Vascular: dorsalis pedis and posterior tibial pulses are palpable bilateral. Capillary return is immediate. Temperature gradient is WNL. Skin turgor WNL  Sensorium: Normal Semmes Weinstein monofilament test. Normal tactile sensation bilaterally. Nail Exam: Pt has thick disfigured discolored nails with subungual debris noted bilateral entire nail hallux through fifth toenails.  Pain on palpation to the nails. Ulcer Exam: There is no evidence of ulcer or pre-ulcerative changes or infection. Orthopedic Exam: Muscle tone and strength are WNL. No limitations in general ROM. No crepitus or effusions noted.  Skin: No Porokeratosis. No infection or ulcers    Assessment & Plan:   1. Pain due to onychomycosis of toenails of both feet     Patient was evaluated and treated and all questions answered.  Onychomycosis with pain  -Nails palliatively debrided as below. -Educated on self-care  Procedure: Nail Debridement Rationale: pain  Type of Debridement: manual, sharp debridement. Instrumentation: Nail nipper, rotary burr. Number of Nails: 10  Procedures and Treatment: Consent by patient was obtained for treatment procedures. The patient understood the discussion of treatment and procedures well. All questions were answered thoroughly reviewed. Debridement of mycotic and hypertrophic toenails, 1 through 5 bilateral and clearing of subungual debris. No ulceration, no infection noted.  Return Visit-Office Procedure: Patient  instructed to return to the office for a follow up visit 3 months for continued evaluation and treatment.  Boneta Lucks, DPM    Return in about 3 months (around 06/25/2022) for [facility] .

## 2022-04-17 ENCOUNTER — Ambulatory Visit: Payer: Medicare Other | Admitting: Podiatry

## 2022-05-19 DIAGNOSIS — I4811 Longstanding persistent atrial fibrillation: Secondary | ICD-10-CM | POA: Diagnosis not present

## 2022-05-19 DIAGNOSIS — E039 Hypothyroidism, unspecified: Secondary | ICD-10-CM | POA: Diagnosis not present

## 2022-05-19 DIAGNOSIS — S82402A Unspecified fracture of shaft of left fibula, initial encounter for closed fracture: Secondary | ICD-10-CM | POA: Diagnosis not present

## 2022-05-19 DIAGNOSIS — K219 Gastro-esophageal reflux disease without esophagitis: Secondary | ICD-10-CM

## 2022-05-19 DIAGNOSIS — I5032 Chronic diastolic (congestive) heart failure: Secondary | ICD-10-CM | POA: Diagnosis not present

## 2022-05-21 DIAGNOSIS — N907 Vulvar cyst: Secondary | ICD-10-CM | POA: Diagnosis not present

## 2022-06-18 ENCOUNTER — Non-Acute Institutional Stay (SKILLED_NURSING_FACILITY): Payer: Medicare Other | Admitting: Student

## 2022-06-18 DIAGNOSIS — E039 Hypothyroidism, unspecified: Secondary | ICD-10-CM | POA: Diagnosis not present

## 2022-06-18 DIAGNOSIS — I5032 Chronic diastolic (congestive) heart failure: Secondary | ICD-10-CM

## 2022-06-18 DIAGNOSIS — D62 Acute posthemorrhagic anemia: Secondary | ICD-10-CM

## 2022-06-18 DIAGNOSIS — I4940 Unspecified premature depolarization: Secondary | ICD-10-CM

## 2022-06-18 DIAGNOSIS — M79674 Pain in right toe(s): Secondary | ICD-10-CM

## 2022-06-18 DIAGNOSIS — I10 Essential (primary) hypertension: Secondary | ICD-10-CM

## 2022-06-18 DIAGNOSIS — N1831 Chronic kidney disease, stage 3a: Secondary | ICD-10-CM

## 2022-06-18 DIAGNOSIS — S82202D Unspecified fracture of shaft of left tibia, subsequent encounter for closed fracture with routine healing: Secondary | ICD-10-CM | POA: Diagnosis not present

## 2022-06-18 DIAGNOSIS — B351 Tinea unguium: Secondary | ICD-10-CM

## 2022-06-18 DIAGNOSIS — S82402D Unspecified fracture of shaft of left fibula, subsequent encounter for closed fracture with routine healing: Secondary | ICD-10-CM

## 2022-06-18 DIAGNOSIS — M79675 Pain in left toe(s): Secondary | ICD-10-CM

## 2022-06-18 DIAGNOSIS — L98429 Non-pressure chronic ulcer of back with unspecified severity: Secondary | ICD-10-CM

## 2022-06-18 NOTE — Progress Notes (Signed)
Provider:   [name] Room Number: 683 MHDQQ of Service:  Nursing ([phone])  PCP: Dewayne Shorter, MD Patient Care Team: Dewayne Shorter, MD as PCP - General (Family Medicine)  Extended Emergency Contact Information Primary Emergency Contact: Ireland-Conte,Dominique Mobile Phone: [phone] Relation: Daughter Secondary Emergency Contact: Mims,Michele Mobile Phone: [phone] Relation: Daughter  Code Status: DNAR Goals of Care: Advanced Directive information    10/13/2021    5:10 PM  Advanced Directives  Would patient like information on creating a medical advance directive? No - Patient declined    No chief complaint on file.   HPI: Patient is a 88 y.o. female seen today for admission to [facility] after left Tib/fib fracture. She has a PMJ for HTN< Afib, HLD, hypothyroidism, DM, who presented on 7/31 after a mechanical fall. She was readmitted on 8;16 and had an ORIF.  She was hypotensive postoperatively and was in the Icu for 24 hours. Received 2u PRBCs on 8/19 for Hgb of 6.7. She received 2 additional unitcs of PRBCs. She has a pressure wound - santyl once daily Irron supplementatoin. She is on ramelteon for insomnia.  She has had gone back and forth between diarrhea and constipation. She had a bowel movement last night.Pain is well-controlled She started working with therapy today. She denies palpitations, lightheadedness. She was started on metoprolol 12.5 mg daily for afib-- propranolol was discontinued. Per ortho she is okay to do weight bearing as tolerated. Ortho appoitment in 1 week. DVT ppx ASA 325 BID for 30 days.  Her daughter, Oley Balm, and SIL are at bedside. Her second daughter  is going to take her to her appointment next Wednesday. Will leave the bandage in place until then. Weight bearing as tolerating.   She endorses a Low appetite, however, described a full breakfast with oatmeal and protein. She was started on marinol, however,  after discussion is amenable to discontinuing this medication.During hospitalization she was also started on ramelteon which we are discontinuing at this time as she has never had an issue with sleep. She will have glucerna TID for nutrition and will be seen by nutritionist this week.  She was started on marinol about 1 week ago. Discontinue it today.   Past Medical History:  Diagnosis Date   Dysrhythmia    Hypertension    Hypothyroidism    Past Surgical History:  Procedure Laterality Date   APPENDECTOMY      reports that she has quit smoking. Her smoking use included cigarettes. She has never used smokeless tobacco. She reports that she does not drink alcohol. No history on file for drug use. Social History   Socioeconomic History   Marital status: Married    Spouse name: Not on file   Number of children: Not on file   Years of education: Not on file   Highest education level: Not on file  Occupational History   Not on file  Tobacco Use   Smoking status: Former    Types: Cigarettes   Smokeless tobacco: Never  Substance and Sexual Activity   Alcohol use: Never   Drug use: Not on file   Sexual activity: Not on file  Other Topics Concern   Not on file  Social History Narrative   Not on file   Social Determinants of Health   Financial Resource Strain: Not on file  Food Insecurity: Not on file  Transportation Needs: Not on file  Physical Activity: Not on file  Stress: Not on file  Social Connections: Not on file  Intimate Partner Violence: Not on file    Functional Status Survey:    Family History  Problem Relation Age of Onset   Diabetes Mellitus II Father     Health Maintenance  Topic Date Due   COVID-19 Vaccine (1) Never done   TETANUS/TDAP  Never done   Zoster Vaccines- Shingrix (1 of 2) Never done   Pneumonia Vaccine 86+ Years old (1 - PCV) Never done   DEXA SCAN  Never done   INFLUENZA VACCINE  Never done   HPV VACCINES  Aged Out    No Known  Allergies  Outpatient Encounter Medications as of 06/18/2022  Medication Sig   acetaminophen (TYLENOL) 325 MG suppository Place 325 mg rectally every 4 (four) hours as needed.   acetaminophen (TYLENOL) 325 MG tablet Take 650 mg by mouth every 4 (four) hours as needed.   bumetanide (BUMEX) 1 MG tablet Take 1 mg by mouth daily.   cephALEXin (KEFLEX) 500 MG capsule Take 1 capsule (500 mg total) by mouth 4 (four) times daily.   cholecalciferol (VITAMIN D) 25 MCG (1000 UNIT) tablet Take 1,000 Units by mouth daily.   diclofenac Sodium (VOLTAREN) 1 % GEL Apply 1 application topically 4 (four) times daily.   doxycycline (VIBRA-TABS) 100 MG tablet Take 1 tablet (100 mg total) by mouth 2 (two) times daily.   fluticasone (FLONASE) 50 MCG/ACT nasal spray Place 1 spray into both nostrils 2 (two) times daily.   mupirocin ointment (BACTROBAN) 2 % Apply 1 application. topically 2 (two) times daily.   propranolol ER (INDERAL LA) 120 MG 24 hr capsule Take 120 mg by mouth daily.   SYNTHROID 112 MCG tablet Take 112 mcg by mouth daily.   [DISCONTINUED] levothyroxine (SYNTHROID) 100 MCG tablet Take 100 mcg by mouth daily before breakfast.   No facility-administered encounter medications on file as of 06/18/2022.    Review of Systems  Constitutional:  Positive for activity change.  Respiratory:  Negative for shortness of breath.   Cardiovascular:  Negative for chest pain.  Gastrointestinal:        Had a combination of diarrhea and constipation, now becoming more regular.    Genitourinary:  Negative for urgency.  Neurological:  Negative for dizziness.  Psychiatric/Behavioral:  Negative for hallucinations.     Vitals:   06/19/22 0948  BP: 125/62  Pulse: 84  Resp: (!) 22  Temp: (!) 97.3 F (36.3 C)  SpO2: 93%  Weight: 181 lb (82.1 kg)   Body mass index is 28.35 kg/m. Physical Exam Constitutional:      Appearance: Normal appearance.     Comments: Laying in recliner chair   Cardiovascular:     Rate  and Rhythm: Normal rate.     Pulses: Normal pulses.     Heart sounds: Normal heart sounds.  Abdominal:     Palpations: Abdomen is soft.  Musculoskeletal:     Cervical back: Normal range of motion and neck supple.  Skin:    General: Skin is warm.     Comments: Left leg with soft cast up to the knee. Able to wiggle toes, cap refil <2 seconds, sensation intact  Neurological:     General: No focal deficit present.     Mental Status: She is alert and oriented to person, place, and time.    Labs reviewed: Basic Metabolic Panel: Recent Labs    10/12/21 0419 10/15/21 0804 10/16/21 0400 10/31/21 1039  NA 141 139 140 136  K  4.0 3.3* 3.3* 3.7  CL 103 99 100 93*  CO2 '29 30 30 '$ 32  GLUCOSE 157* 121* 103* 150*  BUN 26* 22 23 32*  CREATININE 0.94 0.90 1.00 1.07*  CALCIUM 9.0 8.4* 8.5* 9.2  MG 1.9 1.8  --   --    Liver Function Tests: Recent Labs    10/10/21 1204 10/31/21 1039  AST 20 19  ALT 10 9  ALKPHOS 168* 140*  BILITOT 0.9 1.0  PROT 7.9 8.3*  ALBUMIN 3.1* 3.4*   No results for input(s): "LIPASE", "AMYLASE" in the last 8760 hours. No results for input(s): "AMMONIA" in the last 8760 hours. CBC: Recent Labs    10/10/21 1204 10/11/21 0517 10/12/21 0419 10/31/21 1039  WBC 10.8* 9.4 9.7 10.4  NEUTROABS 9.2*  --   --  8.5*  HGB 12.0 10.7* 10.7* 12.5  HCT 37.6 33.5* 34.2* 39.2  MCV 92.4 91.5 93.7 90.7  PLT 253 245 243 238   Cardiac Enzymes: No results for input(s): "CKTOTAL", "CKMB", "CKMBINDEX", "TROPONINI" in the last 8760 hours. BNP: Invalid input(s): "POCBNP" Lab Results  Component Value Date   HGBA1C 6.3 (H) 10/11/2021   Lab Results  Component Value Date   TSH 3.530 10/15/2021   No results found for: "VITAMINB12" No results found for: "FOLATE" No results found for: "IRON", "TIBC", "FERRITIN"  Imaging and Procedures obtained prior to SNF admission: No results found.  Assessment/Plan 1. Tibia/fibula fracture, left, closed, with routine healing,  subsequent encounter Patient has repeat fracture of LLE. Previously had fracture of ankle, now has fracture of distal tibia which occurred at her follow up appointment. Goal is to return home with 24 hour care and aid. Will continue PT/OT. Pain well controlled with PRN tylenol. Will schedule Voltaren gel before her PT appointments. Patient was in the ICU post op.   2. Essential hypertension BP well controlled on current regmien.   3. Hypothyroidism, unspecified type Well controlled on Synthroid 112 mcg. Will continue. Annual TSH levels   4. Chronic kidney disease, stage 3a ([place]) Patient with chronic kidney disease. Cr Cl without need of renal adjustment. Repeat BMP in 1 week given hypovolemic AKI during hospitalization.   5. Chronic diastolic CHF (congestive heart failure) (HCC) Weight stable and well controlled symptoms on bumetinide 1 mg daily daily. Will continue to monitor for symptoms.   6. Pain due to onychomycosis of toenails of both feet Patient's pain well-controlled. Would like to be added to podiatry list for toenail clippings.   7. Cardiac arrhythmia due to premature depolarization, unspecified type Patient was previously on propranolol. Now on metoprolol 12.5 mg BID. Agree with this change given patient's age.   8. Acute Blood Loss Anemia Patient received 4 units of PRBCs due to anemia in hospitalization at [facility]. Will plan for CBC in 1 week and 1 mo. Continue oral iron supplementation. Discussed possibility of iron infusion if patient is not having results with oral iron.   9. Stage 2 sacral ulcer Diagnosed at OSH during hospitalization. Continue Santyl daily dressing changes. Wound Care consult, eval, treat.   Family/ staff Communication: Spoke with nursing staff and daughter Oley Balm.   Labs/tests ordered: CBC, BMP  Tomasa Rand, MD, [facility] [phone]

## 2022-06-19 ENCOUNTER — Encounter: Payer: Self-pay | Admitting: Student

## 2022-06-26 ENCOUNTER — Ambulatory Visit: Payer: Medicare Other | Admitting: Podiatry

## 2022-07-07 ENCOUNTER — Non-Acute Institutional Stay (SKILLED_NURSING_FACILITY): Payer: Medicare Other | Admitting: Student

## 2022-07-07 DIAGNOSIS — C541 Malignant neoplasm of endometrium: Secondary | ICD-10-CM | POA: Diagnosis not present

## 2022-07-07 DIAGNOSIS — R296 Repeated falls: Secondary | ICD-10-CM

## 2022-07-08 ENCOUNTER — Non-Acute Institutional Stay (SKILLED_NURSING_FACILITY): Payer: Medicare Other | Admitting: Nurse Practitioner

## 2022-07-08 ENCOUNTER — Encounter: Payer: Self-pay | Admitting: Nurse Practitioner

## 2022-07-08 DIAGNOSIS — L602 Onychogryphosis: Secondary | ICD-10-CM | POA: Diagnosis not present

## 2022-07-08 DIAGNOSIS — M25571 Pain in right ankle and joints of right foot: Secondary | ICD-10-CM | POA: Diagnosis not present

## 2022-07-08 NOTE — Progress Notes (Signed)
Location:  [facility]) Nursing Home Room Number: 113-A Place of Service:  SNF [phone]) Provider:  Carlos American. Dewaine Oats, NP   Patient Care Team: Dewayne Shorter, MD as PCP - General [facility] Medicine)  Extended Emergency Contact Information Primary Emergency Contact: Ireland-Conte,Dominique Mobile Phone: [phone] Relation: Daughter Secondary Emergency Contact: Mims,Michele Mobile Phone: [phone] Relation: Daughter  Code Status:  DNR Goals of care: Advanced Directive information    10/13/2021    5:10 PM  Advanced Directives  Would patient like information on creating a medical advance directive? No - Patient declined     Chief Complaint  Patient presents with   Acute Visit    Fall, patient with swollen right ankle     HPI:  Pt is a 88 y.o. female seen today for an acute visit for follow up fall.  She had an assisted fall yesterday with increase in pain to right leg/ankle.  No fracture noted but soft tissue swelling noted to ankle.  Nursing wrapped with ace bandage and pain has improved with this and tylenol PRN.  No redness or discoloration noted.  Swelling has improved with ace wrap.    Past Medical History:  Diagnosis Date   Dysrhythmia    Hypertension    Hypothyroidism    Past Surgical History:  Procedure Laterality Date   APPENDECTOMY      No Known Allergies  Outpatient Encounter Medications as of 07/08/2022  Medication Sig   acetaminophen (TYLENOL) 325 MG suppository Place 325 mg rectally every 4 (four) hours as needed.   aspirin EC 325 MG tablet Take 325 mg by mouth 2 (two) times daily.   bumetanide (BUMEX) 1 MG tablet Take 1 mg by mouth daily.   carboxymethylcellulose (REFRESH PLUS) 0.5 % SOLN Place 1 drop into both eyes every 2 (two) hours as needed (for dry eyes).   cetirizine (ZYRTEC) 5 MG tablet Take 5 mg by mouth as directed. Every 24 hours as needed for allergies   Cholecalciferol (VITAMIN D3) 50 MCG (2000 UT) TABS Take 1 tablet by mouth  daily.   collagenase (SANTYL) 250 UNIT/GM ointment Apply 1 Application topically daily. Above the gluteal cleft   diclofenac Sodium (VOLTAREN) 1 % GEL Apply 1 application  topically every 6 (six) hours as needed. And apply to affected areas topically every day shift for pain before therapy   fluticasone (FLONASE) 50 MCG/ACT nasal spray Place 1 spray into both nostrils 2 (two) times daily.   metoprolol succinate (TOPROL-XL) 25 MG 24 hr tablet Take 12.5 mg by mouth daily.   SYNTHROID 112 MCG tablet Take 112 mcg by mouth daily.   White Petrolatum-Mineral Oil (REFRESH P.M. OP) Apply 1 strip to eye every 12 (twelve) hours as needed (for dry eyes).   [DISCONTINUED] acetaminophen (TYLENOL) 325 MG tablet Take 650 mg by mouth every 4 (four) hours as needed.   [DISCONTINUED] cephALEXin (KEFLEX) 500 MG capsule Take 1 capsule (500 mg total) by mouth 4 (four) times daily.   [DISCONTINUED] cholecalciferol (VITAMIN D) 25 MCG (1000 UNIT) tablet Take 1,000 Units by mouth daily.   [DISCONTINUED] doxycycline (VIBRA-TABS) 100 MG tablet Take 1 tablet (100 mg total) by mouth 2 (two) times daily.   [DISCONTINUED] mupirocin ointment (BACTROBAN) 2 % Apply 1 application. topically 2 (two) times daily.   [DISCONTINUED] propranolol ER (INDERAL LA) 120 MG 24 hr capsule Take 120 mg by mouth daily.   No facility-administered encounter medications on file as of 07/08/2022.    Review of Systems  Constitutional:  Negative  for activity change and appetite change.  Musculoskeletal:  Positive for arthralgias, gait problem, joint swelling and myalgias.  Skin:  Negative for color change and wound.     There is no immunization history on file for this patient. Pertinent  Health Maintenance Due  Topic Date Due   DEXA SCAN  Never done   INFLUENZA VACCINE  Never done      10/14/2021    4:00 PM 10/14/2021    8:00 PM 10/15/2021    8:13 AM 10/15/2021   10:19 PM 10/31/2021    9:57 AM  Fall Risk  Falls in the past year?     0  Was  there an injury with Fall?     0  Fall Risk Category Calculator     0  Fall Risk Category     Low  Patient Fall Risk Level Moderate fall risk Moderate fall risk Moderate fall risk Moderate fall risk    Functional Status Survey:    Vitals:   07/08/22 1508  BP: (!) 149/73  Pulse: 85  Resp: (!) 21  Temp: 98.3 F (36.8 C)  SpO2: 92%  Weight: 182 lb (82.6 kg)  Height: '5\' 7"'$  (1.702 m)   Body mass index is 28.51 kg/m. Physical Exam Constitutional:      General: She is not in acute distress.    Appearance: She is well-developed. She is not diaphoretic.  HENT:     Head: Normocephalic and atraumatic.     Mouth/Throat:     Pharynx: No oropharyngeal exudate.  Eyes:     Conjunctiva/sclera: Conjunctivae normal.     Pupils: Pupils are equal, round, and reactive to light.  Musculoskeletal:        General: Signs of injury present.     Cervical back: Normal range of motion and neck supple.     Right lower leg: Edema present.     Left lower leg: Edema present.     Right ankle: Swelling present. Tenderness present. Decreased range of motion.  Skin:    General: Skin is warm and dry.  Neurological:     Mental Status: She is alert.  Psychiatric:        Mood and Affect: Mood normal.     Labs reviewed: Recent Labs    10/12/21 0419 10/15/21 0804 10/16/21 0400 10/31/21 1039  NA 141 139 140 136  K 4.0 3.3* 3.3* 3.7  CL 103 99 100 93*  CO2 '29 30 30 '$ 32  GLUCOSE 157* 121* 103* 150*  BUN 26* 22 23 32*  CREATININE 0.94 0.90 1.00 1.07*  CALCIUM 9.0 8.4* 8.5* 9.2  MG 1.9 1.8  --   --    Recent Labs    10/10/21 1204 10/31/21 1039  AST 20 19  ALT 10 9  ALKPHOS 168* 140*  BILITOT 0.9 1.0  PROT 7.9 8.3*  ALBUMIN 3.1* 3.4*   Recent Labs    10/10/21 1204 10/11/21 0517 10/12/21 0419 10/31/21 1039  WBC 10.8* 9.4 9.7 10.4  NEUTROABS 9.2*  --   --  8.5*  HGB 12.0 10.7* 10.7* 12.5  HCT 37.6 33.5* 34.2* 39.2  MCV 92.4 91.5 93.7 90.7  PLT 253 245 243 238   Lab Results   Component Value Date   TSH 3.530 10/15/2021   Lab Results  Component Value Date   HGBA1C 6.3 (H) 10/11/2021   No results found for: "CHOL", "HDL", "LDLCALC", "LDLDIRECT", "TRIG", "CHOLHDL"  Significant Diagnostic Results in last 30 days:  No results found.  Assessment/Plan 1. Acute right ankle pain Pain after fall Xray negative for fracture but reports soft tissue swelling.  -she does have tenderness to ankle and with movement but was able to work with PT today and bear weight.  -ice TID for 5 days then PRN -continue tylenol PRN -notify if pain worsens or fails to improve.   2. Overgrown toenails -added to podiatrist list.   Family/ staff Communication: daughter at bedside. Also discussed plan with nursing.   Carlos American. [place], [facility] [phone]

## 2022-07-10 ENCOUNTER — Encounter: Payer: Self-pay | Admitting: Student

## 2022-07-10 NOTE — Progress Notes (Signed)
Location:   [name] Room Number: [place] [phone]) Provider:  Amada Kingfisher, MD  Patient Care Team: Dewayne Shorter, MD as PCP - General (Family Medicine)  Extended Emergency Contact Information Primary Emergency Contact: Ireland-Conte,Dominique Mobile Phone: [phone] Relation: Daughter Secondary Emergency Contact: Mims,Michele Mobile Phone: [phone] Relation: Daughter  Code Status:  DNR Goals of care: Advanced Directive information    07/08/2022    3:22 PM  Advanced Directives  Does Patient Have a Medical Advance Directive? Yes  Type of Advance Directive Out of facility DNR (pink MOST or yellow form)   Does patient want to make changes to medical advance directive? No - Patient declined     Significant value     Chief Complaint  Patient presents with   Fall    HPI:  Pt is a 88 y.o. female seen today for an acute visit for a fall. Patient had a fall yesterday in her room. She states she had nursing with her and she yelled "help me" right before she fell. She doesn't think the right leg is broken. She states if the x-ray is positive she is going to go back to [place] with the physicians she trusts. She has swelling and is disappointed it's keeping her from her therapy.   Denied any symptoms at the time of fall. Denies chest pain, shortness of breath. She just has some tenderness in the swollen leg.   Past Medical History:  Diagnosis Date   Allergic rhinitis    Per [facility]   Anemia    Per [facility]   Atherosclerotic heart disease of native coronary artery with other forms of angina pectoris ([place])    Per [facility]   Chronic a-fib ([place])    Per [facility]   Dysrhythmia    History of falling    Per [facility]   Hypertension    Hypertensive heart and chronic kidney disease with heart failure and stage 1 through stage 4 chronic kidney disease, or unspecified chronic kidney disease ([place])     Per [facility]   Hypothyroidism    Insomnia    Per [facility]   Muscle weakness (generalized)    Per [facility]   Need for assistance with personal care    Other lack of coordination    Per [facility]   Pressure ulcer of sacral region, stage 2 ([place])    Per [facility]   Type 2 diabetes mellitus with chronic kidney disease ([place])    Per [facility]   Unspecified fracture of shaft of left fibula, subsequent encounter for closed fracture with routine healing    Per [facility]   Vitamin D deficiency    Per [facility]   Past Surgical History:  Procedure Laterality Date   APPENDECTOMY      No Known Allergies  Outpatient Encounter Medications as of 07/07/2022  Medication Sig   acetaminophen (TYLENOL) 325 MG suppository Place 325 mg rectally every 4 (four) hours as needed.   bumetanide (BUMEX) 1 MG tablet Take 1 mg by mouth daily.   diclofenac Sodium (VOLTAREN) 1 % GEL Apply 1 application  topically every 6 (six) hours as needed. And apply to affected areas topically every day shift for pain before therapy   fluticasone (FLONASE) 50 MCG/ACT nasal spray Place 1 spray into both nostrils 2 (two) times daily.   SYNTHROID 112 MCG tablet Take 112 mcg by mouth daily.   [DISCONTINUED] acetaminophen (  TYLENOL) 325 MG tablet Take 650 mg by mouth every 4 (four) hours as needed.   [DISCONTINUED] cephALEXin (KEFLEX) 500 MG capsule Take 1 capsule (500 mg total) by mouth 4 (four) times daily.   [DISCONTINUED] cholecalciferol (VITAMIN D) 25 MCG (1000 UNIT) tablet Take 1,000 Units by mouth daily.   [DISCONTINUED] doxycycline (VIBRA-TABS) 100 MG tablet Take 1 tablet (100 mg total) by mouth 2 (two) times daily.   [DISCONTINUED] mupirocin ointment (BACTROBAN) 2 % Apply 1 application. topically 2 (two) times daily.   [DISCONTINUED] propranolol ER (INDERAL LA) 120 MG 24 hr capsule Take 120 mg by mouth daily.   No facility-administered encounter medications on file as of 07/07/2022.    Review of Systems  All  other systems reviewed and are negative.   There is no immunization history on file for this patient. Pertinent  Health Maintenance Due  Topic Date Due   DEXA SCAN  Never done   INFLUENZA VACCINE  Never done      10/14/2021    4:00 PM 10/14/2021    8:00 PM 10/15/2021    8:13 AM 10/15/2021   10:19 PM 10/31/2021    9:57 AM  Fall Risk  Falls in the past year?     0  Was there an injury with Fall?     0  Fall Risk Category Calculator     0  Fall Risk Category     Low  Patient Fall Risk Level Moderate fall risk Moderate fall risk Moderate fall risk Moderate fall risk    Functional Status Survey:    Vitals:   07/07/22 1033  BP: 123/68  Pulse: 84  Resp: 20  Temp: 98 F (36.7 C)  SpO2: 98%  Weight: 182 lb (82.6 kg)   Body mass index is 28.51 kg/m. Physical Exam Constitutional:      Appearance: Normal appearance.  Cardiovascular:     Rate and Rhythm: Normal rate.     Heart sounds: Normal heart sounds.  Pulmonary:     Effort: Pulmonary effort is normal.     Breath sounds: Normal breath sounds.  Abdominal:     General: Bowel sounds are normal.     Palpations: Abdomen is soft.  Musculoskeletal:     Comments: 2+ swelling and tenderness of the right lower leg. No bruising of deformities of the lower or upper leg. Left leg in soft cast placed by orthopedic surgeon  Neurological:     Mental Status: She is alert and oriented to person, place, and time.   Labs reviewed: Recent Labs    10/12/21 0419 10/15/21 0804 10/16/21 0400 10/31/21 1039  NA 141 139 140 136  K 4.0 3.3* 3.3* 3.7  CL 103 99 100 93*  CO2 '29 30 30 '$ 32  GLUCOSE 157* 121* 103* 150*  BUN 26* 22 23 32*  CREATININE 0.94 0.90 1.00 1.07*  CALCIUM 9.0 8.4* 8.5* 9.2  MG 1.9 1.8  --   --    Recent Labs    10/10/21 1204 10/31/21 1039  AST 20 19  ALT 10 9  ALKPHOS 168* 140*  BILITOT 0.9 1.0  PROT 7.9 8.3*  ALBUMIN 3.1* 3.4*   Recent Labs    10/10/21 1204 10/11/21 0517 10/12/21 0419 10/31/21 1039  WBC  10.8* 9.4 9.7 10.4  NEUTROABS 9.2*  --   --  8.5*  HGB 12.0 10.7* 10.7* 12.5  HCT 37.6 33.5* 34.2* 39.2  MCV 92.4 91.5 93.7 90.7  PLT 253 245 243 238  Lab Results  Component Value Date   TSH 3.530 10/15/2021   Lab Results  Component Value Date   HGBA1C 6.3 (H) 10/11/2021   No results found for: "CHOL", "HDL", "LDLCALC", "LDLDIRECT", "TRIG", "CHOLHDL"  Significant Diagnostic Results in last 30 days:  No results found.  Assessment/Plan Added to med history. No symptoms at this time. Will continue to monitor for symptoms. Continue Goals of care conversations.   2. Multiple falls Patient had a fall fracturing tib/fib fracture which was the indication for her admission to healthcare center. Right leg with swelling. X-ray pending. Will follow up as indicated. Tylenol PRN available. Encourage rest and elevation given unknown status of joint.     Family/ staff Communication: Nursing  Labs/tests ordered:  none.  Tomasa Rand, MD, [facility] [phone]

## 2022-07-13 ENCOUNTER — Telehealth: Payer: Self-pay | Admitting: Student

## 2022-07-13 NOTE — Telephone Encounter (Signed)
Received call as on call provider. Patient admitted to skilled nursing for left Tib/fib fractures. Of note, patient has had two assisted falls this week and has had notable increased RLE swelling. Patient is on ASA only for anticoagulation.   Vital signs notable for HR 125 and RR 22. Given history of atrial fibrillation transfer to ED for work up given A. Fib w/ RVR, recent fracture w/o anticoagulation, and diminished breath sounds on exam by nursing staff.   Will transfer to ED for evaluation C/f pnuemonia vs PE.   Tomasa Rand, MD, [facility] [phone]

## 2022-07-14 ENCOUNTER — Telehealth: Payer: Self-pay | Admitting: Nurse Practitioner

## 2022-07-14 ENCOUNTER — Encounter: Payer: Self-pay | Admitting: Student

## 2022-07-14 ENCOUNTER — Non-Acute Institutional Stay (SKILLED_NURSING_FACILITY): Payer: Medicare Other | Admitting: Student

## 2022-07-14 DIAGNOSIS — I10 Essential (primary) hypertension: Secondary | ICD-10-CM

## 2022-07-14 DIAGNOSIS — I480 Paroxysmal atrial fibrillation: Secondary | ICD-10-CM | POA: Diagnosis not present

## 2022-07-14 DIAGNOSIS — I5032 Chronic diastolic (congestive) heart failure: Secondary | ICD-10-CM | POA: Diagnosis not present

## 2022-07-14 DIAGNOSIS — I130 Hypertensive heart and chronic kidney disease with heart failure and stage 1 through stage 4 chronic kidney disease, or unspecified chronic kidney disease: Secondary | ICD-10-CM | POA: Diagnosis not present

## 2022-07-14 NOTE — Telephone Encounter (Signed)
Staff called: the patient is afebrile, congestive cough has been addressed with increased Bumetanide by Dr. Unk Lightning, 07/14/22 Na 131, K 4.0, Bun 22, creat 0.65, wbc 15.0, Hgb 9.0, plt 274, neutrophils 88.9%, UA, bacteria moderate, Cipro '250mg'$  bid x7 days for empirical tx.

## 2022-07-14 NOTE — Progress Notes (Signed)
Location:  Other Nursing Home Room Number: 113-A Place of Service:  SNF (31) Provider: Dewayne Shorter, MD   Dewayne Shorter, MD  Patient Care Team: Dewayne Shorter, MD as PCP - General (Family Medicine)  Extended Emergency Contact Information Primary Emergency Contact: Ireland-Conte,Dominique Mobile Phone: [phone] Relation: Daughter Secondary Emergency Contact: [place] Mobile Phone: [phone] Relation: Daughter  Code Status:  DNR Goals of care: Advanced Directive information    07/14/2022   11:40 AM  Advanced Directives  Does Patient Have a Medical Advance Directive? Yes  Type of Advance Directive Out of facility DNR (pink MOST or yellow form)  Does patient want to make changes to medical advance directive? No - Patient declined     Chief Complaint  Patient presents with   Acute Visit    Elevated heartrate    HPI:  Pt is a 88 y.o. female seen today for an acute visit for congested cough. Patient states she feels fine now. Received calls over the weekend that patient's HR was in th120s. When asking Ms. Nicolls, she states "they tell my my heart has been racing, but I don't feel it."  She denies chest pain, shortness of breath, dizziness, dysuria, constipation.  She does endorse continued lower extremity edema.  She also endorses that she previously was on 2 tablets of diuretics at home and is not sure when that change occurred.  Called her daughter Sharyn Lull to discuss today's visit.  Discussed patient's chest x-ray results and the fact that it appeared to be volume overload.  Given patient's stability will defer transfer to higher level of care.  CBC and BMP are still pending at this time, if emergent discussed that this may be an indication for transfer to higher level of care if patient is anemic.  Daughter endorsed understanding and agreed with current plan.   Past Medical History:  Diagnosis Date   Allergic rhinitis    Per [facility]   Anemia     Per [facility]   Atherosclerotic heart disease of native coronary artery with other forms of angina pectoris ([place])    Per [facility]   Chronic a-fib ([place])    Per [facility]   Dysrhythmia    History of falling    Per [facility]   Hypertension    Hypertensive heart and chronic kidney disease with heart failure and stage 1 through stage 4 chronic kidney disease, or unspecified chronic kidney disease ([place])    Per [facility]   Hypothyroidism    Insomnia    Per [facility]   Muscle weakness (generalized)    Per [facility]   Need for assistance with personal care    Other lack of coordination    Per [facility]   Pressure ulcer of sacral region, stage 2 ([place])    Per [facility]   Type 2 diabetes mellitus with chronic kidney disease ([place])    Per [facility]   Unspecified fracture of shaft of left fibula, subsequent encounter for closed fracture with routine healing    Per [facility]   Vitamin D deficiency    Per [facility]   Past Surgical History:  Procedure Laterality Date   APPENDECTOMY      No Known Allergies  Outpatient Encounter Medications as of 07/14/2022  Medication Sig   acetaminophen (TYLENOL) 325 MG suppository Place 325 mg rectally every 4 (four) hours as needed.   aspirin EC 325 MG tablet Take 325 mg by mouth 2 (two) times daily.  bumetanide (BUMEX) 1 MG tablet Take 1 mg by mouth daily.   carboxymethylcellulose (REFRESH PLUS) 0.5 % SOLN Place 1 drop into both eyes every 2 (two) hours as needed (for dry eyes).   cetirizine (ZYRTEC) 5 MG tablet Take 5 mg by mouth as directed. Every 24 hours as needed for allergies   Cholecalciferol (VITAMIN D3) 50 MCG (2000 UT) TABS Take 1 tablet by mouth daily.   collagenase (SANTYL) 250 UNIT/GM ointment Apply 1 Application topically daily. Above the gluteal cleft   diclofenac Sodium (VOLTAREN) 1 % GEL Apply 1 application  topically every 6 (six) hours as needed. And apply to affected areas topically every day shift for pain before therapy    docusate sodium (COLACE) 100 MG capsule Take 100 mg by mouth 2 (two) times daily.   ferrous sulfate 325 (65 FE) MG tablet Take 325 mg by mouth daily.   fluticasone (FLONASE) 50 MCG/ACT nasal spray Place 1 spray into both nostrils 2 (two) times daily.   metoprolol succinate (TOPROL-XL) 25 MG 24 hr tablet Take 12.5 mg by mouth daily.   SYNTHROID 112 MCG tablet Take 112 mcg by mouth daily.   White Petrolatum-Mineral Oil (REFRESH P.M. OP) Apply 1 strip to eye every 12 (twelve) hours as needed (for dry eyes).   No facility-administered encounter medications on file as of 07/14/2022.    Review of Systems  All other systems reviewed and are negative.   Immunization History  Administered Date(s) Administered   PFIZER(Purple Top)SARS-COV-2 Vaccination 11/11/2019, 12/05/2019   Pertinent  Health Maintenance Due  Topic Date Due   DEXA SCAN  Never done   INFLUENZA VACCINE  Never done      10/14/2021    4:00 PM 10/14/2021    8:00 PM 10/15/2021    8:13 AM 10/15/2021   10:19 PM 10/31/2021    9:57 AM  Fall Risk  Falls in the past year?     0  Was there an injury with Fall?     0  Fall Risk Category Calculator     0  Fall Risk Category     Low  Patient Fall Risk Level Moderate fall risk Moderate fall risk Moderate fall risk Moderate fall risk    Functional Status Survey:    Vitals:   07/14/22 1138  BP: 131/67  Pulse: 87  Resp: 18  Temp: (!) 96.8 F (36 C)  SpO2: 97%  Weight: 182 lb (82.6 kg)  Height: '5\' 7"'$  (1.702 m)   Body mass index is 28.51 kg/m. Physical Exam Constitutional:      Appearance: Normal appearance.  Cardiovascular:     Rate and Rhythm: Normal rate. Rhythm irregular.     Pulses: Normal pulses.     Heart sounds: Normal heart sounds.  Pulmonary:     Effort: Pulmonary effort is normal.     Comments: Bilateral and expiratory wheezes. Abdominal:     General: Abdomen is flat. Bowel sounds are normal.     Palpations: Abdomen is soft.  Musculoskeletal:        General:  Swelling present.     Comments: 2+ pitting edema lower extremities.  Skin:    General: Skin is warm and dry.     Capillary Refill: Capillary refill takes 2 to 3 seconds.  Neurological:     Mental Status: She is alert and oriented to person, place, and time.    Labs reviewed: Recent Labs    10/12/21 0419 10/15/21 0804 10/16/21 0400 10/31/21 1039  NA  141 139 140 136  K 4.0 3.3* 3.3* 3.7  CL 103 99 100 93*  CO2 '29 30 30 '$ 32  GLUCOSE 157* 121* 103* 150*  BUN 26* 22 23 32*  CREATININE 0.94 0.90 1.00 1.07*  CALCIUM 9.0 8.4* 8.5* 9.2  MG 1.9 1.8  --   --    Recent Labs    10/10/21 1204 10/31/21 1039  AST 20 19  ALT 10 9  ALKPHOS 168* 140*  BILITOT 0.9 1.0  PROT 7.9 8.3*  ALBUMIN 3.1* 3.4*   Recent Labs    10/10/21 1204 10/11/21 0517 10/12/21 0419 10/31/21 1039  WBC 10.8* 9.4 9.7 10.4  NEUTROABS 9.2*  --   --  8.5*  HGB 12.0 10.7* 10.7* 12.5  HCT 37.6 33.5* 34.2* 39.2  MCV 92.4 91.5 93.7 90.7  PLT 253 245 243 238   Lab Results  Component Value Date   TSH 3.530 10/15/2021   Lab Results  Component Value Date   HGBA1C 6.3 (H) 10/11/2021   No results found for: "CHOL", "HDL", "LDLCALC", "LDLDIRECT", "TRIG", "CHOLHDL"  Significant Diagnostic Results in last 30 days:  No results found.  Assessment/Plan 1. Chronic diastolic (congestive) heart failure (HCC) Patient with history of heart failure.  At this time notable increased congestion cough without production of mucus or purulent matter.  Chest x-ray consistent with pulmonary edema with bilateral pulmonary effusions.  Patient also endorses the current dose of diuretic is lower than what she had previously.  We will plan to increase dose of bumetanide to 2 mg daily with an additional dose at night for the next 3 days.  Continue metoprolol succinate 12.5 mg daily.  Ordered CBC and BMP which are still pending at this time.  Potential concern for symptomatic anemia given most recent hemoglobin was 8.1 while in hospital.   If hemoglobin is less than 7 will need on call provider to  notify patient and family members for t potential transfer to emergency department for transfusion.  2. Hypertensive heart and chronic kidney disease with heart failure and stage 1 through stage 4 chronic kidney disease, or unspecified chronic kidney disease ([place]) Patient with no notable increased bilateral lower extremity swelling.  Will evaluate for AKI versus worsening kidney disease.  BMP pending.  3. Paroxysmal atrial fibrillation (HCC) Heart rate within normal range at 89 today.  We will continue to monitor with every shift vital signs.  Discussed education with nursing today regarding heart rate greater than 100 is concerning for patient with atrial fibrillation.  Continue metoprolol 12.5 mg daily.  4. Essential hypertension Blood pressure well controlled despite changes in heart rate.  We will continue current regimen with metoprolol     Family/ staff Communication: Selinda Eon and nursing staff.   Labs/tests ordered:  BMP, CBC, CXR

## 2022-07-17 LAB — CBC AND DIFFERENTIAL
HCT: 29 — AB (ref 36–46)
Hemoglobin: 9.6 — AB (ref 12.0–16.0)
Neutrophils Absolute: 13335
Platelets: 274 K/uL (ref 150–400)
WBC: 15

## 2022-07-17 LAB — BASIC METABOLIC PANEL
BUN: 22 — AB (ref 4–21)
CO2: 3 — AB (ref 13–22)
Chloride: 90 — AB (ref 99–108)
Creatinine: 0.8 (ref 0.5–1.1)
Glucose: 209
Potassium: 4 mEq/L (ref 3.5–5.1)
Sodium: 132 — AB (ref 137–147)

## 2022-07-17 LAB — COMPREHENSIVE METABOLIC PANEL WITH GFR: Calcium: 8.1 — AB (ref 8.7–10.7)

## 2022-07-17 LAB — CBC: RBC: 3.3 — AB (ref 3.87–5.11)

## 2022-07-18 ENCOUNTER — Telehealth: Payer: Self-pay | Admitting: Student

## 2022-07-18 NOTE — Telephone Encounter (Signed)
Received call as on call provider to review patient's labs. She had repeat labs yesterday after the ones collected on 10/2.   Sodium 132, Cr 0.81, Hgb 9.6, WBC 15. Urinalysis with 3+ LE, Many bacteria, 3+ blood as well consistent with UTI.   Patient was started on levoquin for 7 days due to presumed pneumonia. Will continue to monitor for need of additional medication changes.   Tomasa Rand, MD, [facility] [phone]

## 2022-07-30 ENCOUNTER — Encounter: Payer: Self-pay | Admitting: Nurse Practitioner

## 2022-07-30 ENCOUNTER — Non-Acute Institutional Stay (SKILLED_NURSING_FACILITY): Payer: Medicare Other | Admitting: Nurse Practitioner

## 2022-07-30 DIAGNOSIS — Z8541 Personal history of malignant neoplasm of cervix uteri: Secondary | ICD-10-CM

## 2022-07-30 DIAGNOSIS — I5032 Chronic diastolic (congestive) heart failure: Secondary | ICD-10-CM | POA: Diagnosis not present

## 2022-07-30 DIAGNOSIS — R5381 Other malaise: Secondary | ICD-10-CM

## 2022-07-30 DIAGNOSIS — I480 Paroxysmal atrial fibrillation: Secondary | ICD-10-CM

## 2022-07-30 DIAGNOSIS — N1831 Chronic kidney disease, stage 3a: Secondary | ICD-10-CM | POA: Diagnosis not present

## 2022-07-30 DIAGNOSIS — S82202D Unspecified fracture of shaft of left tibia, subsequent encounter for closed fracture with routine healing: Secondary | ICD-10-CM

## 2022-07-30 DIAGNOSIS — I10 Essential (primary) hypertension: Secondary | ICD-10-CM

## 2022-07-30 DIAGNOSIS — S82402D Unspecified fracture of shaft of left fibula, subsequent encounter for closed fracture with routine healing: Secondary | ICD-10-CM

## 2022-07-30 DIAGNOSIS — E039 Hypothyroidism, unspecified: Secondary | ICD-10-CM

## 2022-07-30 DIAGNOSIS — R296 Repeated falls: Secondary | ICD-10-CM

## 2022-07-30 NOTE — Progress Notes (Addendum)
Location:  Other Nursing Home Room Number: [place] of Service:  SNF (31)  Dewayne Shorter, MD  Patient Care Team: Dewayne Shorter, MD as PCP - General (Family Medicine)  Extended Emergency Contact Information Primary Emergency Contact: Ireland-Conte,Dominique Mobile Phone: [phone] Relation: Daughter Secondary Emergency Contact: [place] Mobile Phone: [phone] Relation: Daughter  Goals of care: Advanced Directive information    07/30/2022   11:07 AM  Advanced Directives  Does Patient Have a Medical Advance Directive? Yes  Does patient want to make changes to medical advance directive? No - Patient declined     Chief Complaint  Patient presents with   Acute Visit    Discharge plans    HPI:  Pt is a 88 y.o. female seen today for an acute visit for paper work and discharge planning.  today for admission to [facility] after left Tib/fib fracture. She has a PMJ for HTN< Afib, HLD, hypothyroidism, DM, who presented on 7/31 after a mechanical fall. She was readmitted on 8;16 and had an ORIF.  She will have extended rehab stay at [facility] to help gain strength for standing and tranfers. She previously lived alone but plans to live with daughter after she leaves [facility].   Family requested provider attend care plan meeting and would like paper work for her VA benefits completed.   She continues to be weak with standing and walking. A lot of fear of falling since she has had multiple falls. Some pain in the back of her right calf after a fall, ortho has evaluated.   Her left tib/fib fracture has been healing well.   Noted to have small amount of vaginal bleeding, family reports she does have an IUD. Has not followed with GYN recently. Hx of cervical cancer.   Hx of CHF and on bumex 2 mg daily, no increase in LE edema or shortness of breath noted.     Past Medical History:  Diagnosis Date   Allergic rhinitis    Per [facility]   Anemia    Per [facility]   Atherosclerotic heart disease of native coronary artery with other forms of angina pectoris ([place])    Per [facility]   Chronic a-fib ([place])    Per [facility]   Dysrhythmia    History of falling    Per [facility]   Hypertension    Hypertensive heart and chronic kidney disease with heart failure and stage 1 through stage 4 chronic kidney disease, or unspecified chronic kidney disease ([place])    Per [facility]   Hypothyroidism    Insomnia    Per [facility]   Muscle weakness (generalized)    Per [facility]   Need for assistance with personal care    Other lack of coordination    Per [facility]   Pressure ulcer of sacral region, stage 2 ([place])    Per [facility]   Type 2 diabetes mellitus with chronic kidney disease ([place])    Per [facility]   Unspecified fracture of shaft of left fibula, subsequent encounter for closed fracture with routine healing    Per [facility]   Vitamin D deficiency    Per [facility]   Past Surgical History:  Procedure Laterality Date   APPENDECTOMY      No Known Allergies  Outpatient Encounter Medications as of 07/30/2022  Medication Sig   acetaminophen (TYLENOL) 325 MG suppository Place 325 mg rectally every 4 (four) hours as needed.  bumetanide (BUMEX) 1 MG tablet Take 2 tablets by mouth daily.   carboxymethylcellulose (REFRESH PLUS) 0.5 % SOLN Place 1 drop into both eyes every 2 (two) hours as needed (for dry eyes).   cetirizine (ZYRTEC) 5 MG tablet Take 5 mg by mouth as directed. Every 24 hours as needed for allergies   Cholecalciferol (VITAMIN D3) 50 MCG (2000 UT) TABS Take 1 tablet by mouth daily.   collagenase (SANTYL) 250 UNIT/GM ointment Apply 1 Application topically daily. Above the gluteal cleft   diclofenac Sodium (VOLTAREN) 1 % GEL Apply 1 application  topically every 6 (six) hours as needed. And apply to affected areas topically every day shift for pain before therapy   docusate sodium (COLACE) 100 MG capsule Take 100 mg by mouth every 12  (twelve) hours as needed.   ferrous sulfate 325 (65 FE) MG tablet Take 325 mg by mouth daily.   fluticasone (FLONASE) 50 MCG/ACT nasal spray Place 1 spray into both nostrils 2 (two) times daily.   guaiFENesin (DIABETIC TUSSIN PO) Take 10 mLs by mouth every 4 (four) hours as needed.   metoprolol succinate (TOPROL-XL) 25 MG 24 hr tablet Take 12.5 mg by mouth daily.   Polyethylene Glycol 3350 POWD 1 Scoop by Does not apply route daily.   Saccharomyces boulardii (PROBIOTIC) 250 MG CAPS Take 1 capsule by mouth in the morning and at bedtime.   SYNTHROID 112 MCG tablet Take 112 mcg by mouth daily.   White Petrolatum-Mineral Oil (REFRESH P.M. OP) Apply 1 strip to eye every 12 (twelve) hours as needed (for dry eyes).   No facility-administered encounter medications on file as of 07/30/2022.    Review of Systems  Constitutional:  Negative for activity change, appetite change, fatigue and unexpected weight change.  HENT:  Negative for congestion and hearing loss.   Eyes: Negative.   Respiratory:  Negative for cough and shortness of breath.   Cardiovascular:  Negative for chest pain, palpitations and leg swelling.  Gastrointestinal:  Negative for abdominal pain, constipation and diarrhea.  Genitourinary:  Negative for difficulty urinating and dysuria.  Musculoskeletal:  Positive for arthralgias, gait problem and myalgias.  Skin:  Negative for color change and wound.  Neurological:  Positive for weakness. Negative for dizziness.  Psychiatric/Behavioral:  Negative for agitation, behavioral problems and confusion.     Immunization History  Administered Date(s) Administered   PFIZER(Purple Top)SARS-COV-2 Vaccination 11/11/2019, 12/05/2019   Pertinent  Health Maintenance Due  Topic Date Due   DEXA SCAN  Never done   INFLUENZA VACCINE  Never done      10/14/2021    4:00 PM 10/14/2021    8:00 PM 10/15/2021    8:13 AM 10/15/2021   10:19 PM 10/31/2021    9:57 AM  Fall Risk  Falls in the past year?      0  Was there an injury with Fall?     0  Fall Risk Category Calculator     0  Fall Risk Category     Low  Patient Fall Risk Level Moderate fall risk Moderate fall risk Moderate fall risk Moderate fall risk    Functional Status Survey:    Vitals:   07/30/22 1054  BP: 121/70  Pulse: 89  Resp: (!) 22  Temp: 98 F (36.7 C)  SpO2: 92%  Weight: 181 lb 3.2 oz (82.2 kg)  Height: '5\' 7"'$  (1.702 m)   Body mass index is 28.38 kg/m. Physical Exam Constitutional:      General: She  is not in acute distress.    Appearance: She is well-developed. She is not diaphoretic.  HENT:     Head: Normocephalic and atraumatic.     Mouth/Throat:     Pharynx: No oropharyngeal exudate.  Eyes:     Conjunctiva/sclera: Conjunctivae normal.     Pupils: Pupils are equal, round, and reactive to light.  Cardiovascular:     Rate and Rhythm: Normal rate. Rhythm irregular.     Heart sounds: Normal heart sounds.  Pulmonary:     Effort: Pulmonary effort is normal.     Breath sounds: Normal breath sounds.  Abdominal:     General: Bowel sounds are normal.     Palpations: Abdomen is soft.  Musculoskeletal:     Cervical back: Normal range of motion and neck supple.     Right lower leg: No edema.     Left lower leg: No edema.  Skin:    General: Skin is warm and dry.  Neurological:     Mental Status: She is alert.  Psychiatric:        Mood and Affect: Mood normal.     Labs reviewed: Recent Labs    10/12/21 0419 10/15/21 0804 10/16/21 0400 10/31/21 1039 07/17/22 0826  NA 141 139 140 136 132*  K 4.0 3.3* 3.3* 3.7 4.0  CL 103 99 100 93* 90*  CO2 '29 30 30 '$ 32 3*  GLUCOSE 157* 121* 103* 150*  --   BUN 26* 22 23 32* 22*  CREATININE 0.94 0.90 1.00 1.07* 0.8  CALCIUM 9.0 8.4* 8.5* 9.2 8.1*  MG 1.9 1.8  --   --   --    Recent Labs    10/10/21 1204 10/31/21 1039  AST 20 19  ALT 10 9  ALKPHOS 168* 140*  BILITOT 0.9 1.0  PROT 7.9 8.3*  ALBUMIN 3.1* 3.4*   Recent Labs    10/10/21 1204  10/11/21 0517 10/12/21 0419 10/31/21 1039 07/17/22 0826  WBC 10.8* 9.4 9.7 10.4 15.0  NEUTROABS 9.2*  --   --  8.5* 13,335.00  HGB 12.0 10.7* 10.7* 12.5 9.6*  HCT 37.6 33.5* 34.2* 39.2 29*  MCV 92.4 91.5 93.7 90.7  --   PLT 253 245 243 238 274   Lab Results  Component Value Date   TSH 3.530 10/15/2021   Lab Results  Component Value Date   HGBA1C 6.3 (H) 10/11/2021   No results found for: "CHOL", "HDL", "LDLCALC", "LDLDIRECT", "TRIG", "CHOLHDL"  Significant Diagnostic Results in last 30 days:  No results found.  Assessment/Plan 1. Chronic diastolic (congestive) heart failure (HCC) -euvolcemic at this time, will follow up cmp to make sure electrolytes and kidney function stable.  -will place cardiology referral as she will now be living in area and needs to establish care.   2. Paroxysmal atrial fibrillation (HCC) -rate controlled. Continues on metoprolol.   3. Essential hypertension -Blood pressure well controlled, goal bp <140/90 Continue current medications and dietary modifications follow metabolic panel  4. Chronic kidney disease, stage 3a (HCC) -Chronic and stable Encourage proper hydration Follow metabolic panel Avoid nephrotoxic meds (NSAIDS)  5. Hypothyroidism, unspecified type -TSH stable on last labs, continue synthroid   6. Debility -paperwork completed for VA benefits, continues with nursing, PT and family for supportive care  7. Multiple falls -fall precautions in place, continues to work with PT for safety.   8. Tibia/fibula fracture, left, closed, with routine healing, subsequent encounter -s/p ORIF continues to follow up with ortho, healing well without  increase in pain  9. History of cervical cancer -had previously followed with gyn/onc, does not want any more treatment or follow up however daughters report she does have an IUD placed after treatment therefore they will make appt with GYN to follow up.     Carlos American. Harle Battiest [facility] [phone]   Total time 60 mins:  time greater than 50% of total time spent doing pt counseling and coordination of care and paperwork.

## 2022-07-31 LAB — CBC AND DIFFERENTIAL
HCT: 27 — AB (ref 36–46)
Hemoglobin: 8.7 — AB (ref 12.0–16.0)
Neutrophils Absolute: 12686
Platelets: 246 10*3/uL (ref 150–400)
WBC: 14.4

## 2022-07-31 LAB — HEPATIC FUNCTION PANEL
ALT: 9 U/L (ref 7–35)
AST: 16 (ref 13–35)
Alkaline Phosphatase: 355 — AB (ref 25–125)
Bilirubin, Total: 0.7

## 2022-07-31 LAB — BASIC METABOLIC PANEL
BUN: 24 — AB (ref 4–21)
CO2: 35 — AB (ref 13–22)
Chloride: 94 — AB (ref 99–108)
Creatinine: 0.8 (ref 0.5–1.1)
Glucose: 109
Potassium: 4.2 mEq/L (ref 3.5–5.1)
Sodium: 136 — AB (ref 137–147)

## 2022-07-31 LAB — COMPREHENSIVE METABOLIC PANEL
Albumin: 2.3 — AB (ref 3.5–5.0)
Calcium: 8.4 — AB (ref 8.7–10.7)
Globulin: 3.4
eGFR: 74

## 2022-07-31 LAB — CBC: RBC: 3.28 — AB (ref 3.87–5.11)

## 2022-08-25 LAB — HEMOGLOBIN A1C: Hemoglobin A1C: 6.9

## 2022-08-27 ENCOUNTER — Non-Acute Institutional Stay (SKILLED_NURSING_FACILITY): Payer: Medicare Other | Admitting: Student

## 2022-08-27 ENCOUNTER — Encounter: Payer: Self-pay | Admitting: Student

## 2022-08-27 DIAGNOSIS — F331 Major depressive disorder, recurrent, moderate: Secondary | ICD-10-CM | POA: Diagnosis not present

## 2022-08-27 NOTE — Progress Notes (Unsigned)
Location:  Other [place].  Nursing Home Room Number: Convoy of Service:  SNF [phone]) Provider:  Dr. Amada Kingfisher, MD  Patient Care Team: Dewayne Shorter, MD as PCP - [facility])  Extended Emergency Contact Information Primary Emergency Contact: Ireland-Conte,Dominique Mobile Phone: [phone] Relation: Daughter Secondary Emergency Contact: [place] Mobile Phone: [phone] Relation: Daughter  Code Status:  DNR Goals of care: Advanced Directive information    08/27/2022    1:45 PM  Advanced Directives  Does Patient Have a Medical Advance Directive? Yes  Type of Advance Directive Out of facility DNR (pink MOST or yellow form)  Does patient want to make changes to medical advance directive? No - Patient declined     Chief Complaint  Patient presents with   Medical Management of Chronic Issues    Medical Management of Chronic Issues     HPI:  Pt is a 88 y.o. female seen today for medical management of chronic diseases.     Past Medical History:  Diagnosis Date   Allergic rhinitis    Per [facility]   Anemia    Per [facility]   Atherosclerotic heart disease of native coronary artery with other forms of angina pectoris ([place])    Per [facility]   Chronic a-fib ([place])    Per [facility]   Dysrhythmia    History of falling    Per [facility]   Hypertension    Hypertensive heart and chronic kidney disease with heart failure and stage 1 through stage 4 chronic kidney disease, or unspecified chronic kidney disease ([place])    Per [facility]   Hypothyroidism    Insomnia    Per [facility]   Muscle weakness (generalized)    Per [facility]   Need for assistance with personal care    Other lack of coordination    Per [facility]   Pressure ulcer of sacral region, stage 2 ([place])    Per [facility]   Type 2 diabetes mellitus with chronic kidney disease ([place])    Per [facility]   Unspecified fracture of shaft of left  fibula, subsequent encounter for closed fracture with routine healing    Per [facility]   Vitamin D deficiency    Per [facility]   Past Surgical History:  Procedure Laterality Date   APPENDECTOMY      No Known Allergies  Outpatient Encounter Medications as of 08/27/2022  Medication Sig   acetaminophen (TYLENOL) 325 MG suppository Place 325 mg rectally every 4 (four) hours as needed.   bumetanide (BUMEX) 1 MG tablet Take 2 tablets by mouth daily.   carboxymethylcellulose (REFRESH PLUS) 0.5 % SOLN Place 1 drop into both eyes every 2 (two) hours as needed (for dry eyes).   cetirizine (ZYRTEC) 5 MG tablet Take 5 mg by mouth as directed. Every 24 hours as needed for allergies   Cholecalciferol (VITAMIN D3) 50 MCG (2000 UT) TABS Take 1 tablet by mouth daily.   collagenase (SANTYL) 250 UNIT/GM ointment Apply 1 Application topically daily. Above the gluteal cleft   diclofenac Sodium (VOLTAREN) 1 % GEL Apply 1 application  topically every 6 (six) hours as needed. And apply to affected areas topically every day shift for pain before therapy   docusate sodium (COLACE) 100 MG capsule Take 100 mg by mouth every 12 (twelve) hours as needed.   ferrous sulfate 325 (65 FE) MG tablet Take 325 mg by mouth daily.  fluticasone (FLONASE) 50 MCG/ACT nasal spray Place 1 spray into both nostrils 2 (two) times daily.   guaiFENesin (DIABETIC TUSSIN PO) Take 10 mLs by mouth every 4 (four) hours as needed.   levothyroxine (SYNTHROID) 112 MCG tablet Take 112 mcg by mouth daily before breakfast.   Lidocaine HCl (ASPERCREME LIDOCAINE) 4 % CREA Apply to back of leg topically every 6 hours as needed for pain   metoprolol succinate (TOPROL-XL) 25 MG 24 hr tablet Take 12.5 mg by mouth daily.   Polyethylene Glycol 3350 POWD 1 Scoop by Does not apply route daily.   White Petrolatum-Mineral Oil (REFRESH P.M. OP) Apply 1 strip to eye every 12 (twelve) hours as needed (for dry eyes).   [DISCONTINUED] Saccharomyces boulardii  (PROBIOTIC) 250 MG CAPS Take 1 capsule by mouth in the morning and at bedtime.   [DISCONTINUED] SYNTHROID 112 MCG tablet Take 112 mcg by mouth daily.   No facility-administered encounter medications on file as of 08/27/2022.    Review of Systems  Immunization History  Administered Date(s) Administered   Influenza-Unspecified 07/29/2022   PFIZER(Purple Top)SARS-COV-2 Vaccination 11/11/2019, 12/05/2019   Pertinent  Health Maintenance Due  Topic Date Due   DEXA SCAN  Never done   INFLUENZA VACCINE  Completed      10/14/2021    4:00 PM 10/14/2021    8:00 PM 10/15/2021    8:13 AM 10/15/2021   10:19 PM 10/31/2021    9:57 AM  Fall Risk  Falls in the past year?     0  Was there an injury with Fall?     0  Fall Risk Category Calculator     0  Fall Risk Category     Low  Patient Fall Risk Level Moderate fall risk Moderate fall risk Moderate fall risk Moderate fall risk    Functional Status Survey:    Vitals:   08/27/22 1318  BP: 111/64  Pulse: 84  Resp: 20  Temp: (!) 96.3 F (35.7 C)  SpO2: 93%  Weight: 186 lb 8 oz (84.6 kg)  Height: '5\' 7"'$  (1.702 m)   Body mass index is 29.21 kg/m. Physical Exam  Labs reviewed: Recent Labs    10/12/21 0419 10/15/21 0804 10/16/21 0400 10/31/21 1039 07/17/22 0826 07/31/22 0000  NA 141 139 140 136 132* 136*  K 4.0 3.3* 3.3* 3.7 4.0 4.2  CL 103 99 100 93* 90* 94*  CO2 '29 30 30 '$ 32 3* 35*  GLUCOSE 157* 121* 103* 150*  --   --   BUN 26* 22 23 32* 22* 24*  CREATININE 0.94 0.90 1.00 1.07* 0.8 0.8  CALCIUM 9.0 8.4* 8.5* 9.2 8.1* 8.4*  MG 1.9 1.8  --   --   --   --    Recent Labs    10/10/21 1204 10/31/21 1039 07/31/22 0000  AST '20 19 16  '$ ALT '10 9 9  '$ ALKPHOS 168* 140* 355*  BILITOT 0.9 1.0  --   PROT 7.9 8.3*  --   ALBUMIN 3.1* 3.4* 2.3*   Recent Labs    10/11/21 0517 10/12/21 0419 10/31/21 1039 07/17/22 0826 07/31/22 0000  WBC 9.4 9.7 10.4 15.0 14.4  NEUTROABS  --   --  8.5* 13,335.00 12,686.00  HGB 10.7* 10.7* 12.5 9.6*  8.7*  HCT 33.5* 34.2* 39.2 29* 27*  MCV 91.5 93.7 90.7  --   --   PLT 245 243 238 274 246   Lab Results  Component Value Date   TSH 3.530 10/15/2021  Lab Results  Component Value Date   HGBA1C 6.9 08/25/2022   No results found for: "CHOL", "HDL", "LDLCALC", "LDLDIRECT", "TRIG", "CHOLHDL"  Significant Diagnostic Results in last 30 days:  No results found.  Assessment/Plan There are no diagnoses linked to this encounter.   Family/ staff Communication: ***  Labs/tests ordered:  ***

## 2022-08-28 DIAGNOSIS — F331 Major depressive disorder, recurrent, moderate: Secondary | ICD-10-CM | POA: Insufficient documentation

## 2022-10-13 DEATH — deceased

## 2023-09-09 IMAGING — MR MR FOOT*R* W/O CM
5 series · 40 of 40 positions shown · non-contrast
Comparison: 10/10/2021

CLINICAL DATA: Right foot pain and swelling, erythema

EXAM:
MRI OF THE RIGHT FOREFOOT WITHOUT CONTRAST
TECHNIQUE: Multiplanar, multisequence MR imaging of the right was performed. No
intravenous contrast was administered.

[Series 3: T1 · coronal · right · 3.0mm · 0.44mm/px · 12 of 60 slices shown (1 of 2)]
[im 1/60]
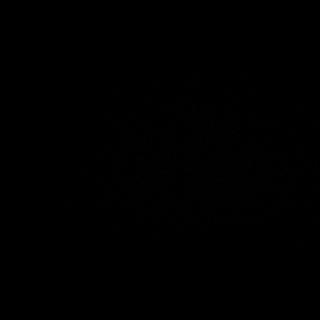
[im 6/60]
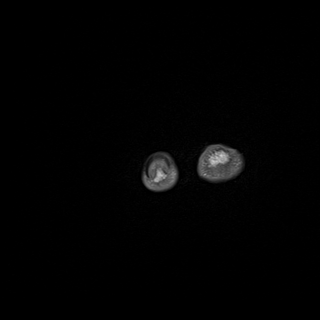
[im 11/60]
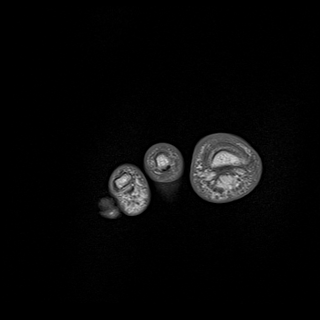
[im 17/60]
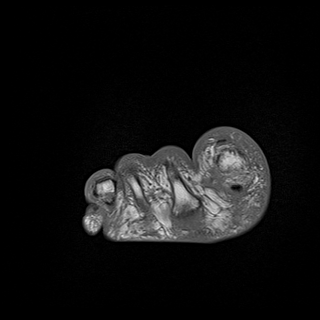
[im 22/60]
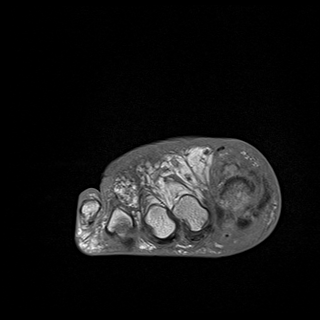
[im 27/60]
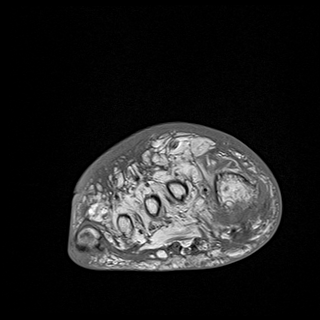
[im 33/60]
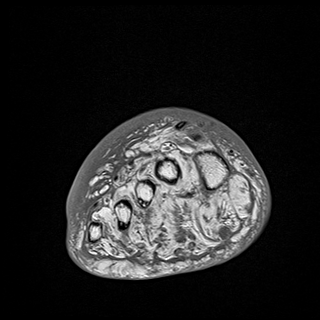
[im 38/60]
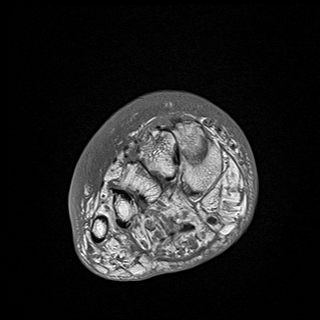
[im 43/60]
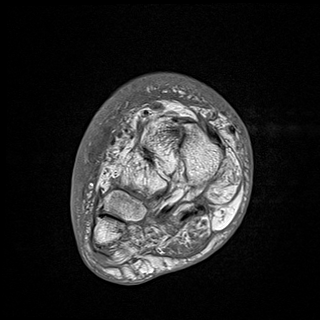
[im 49/60]
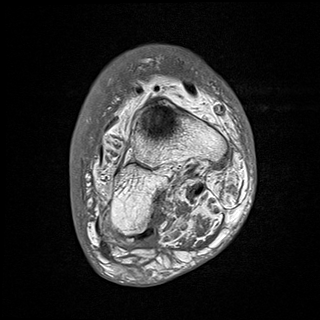
[im 54/60]
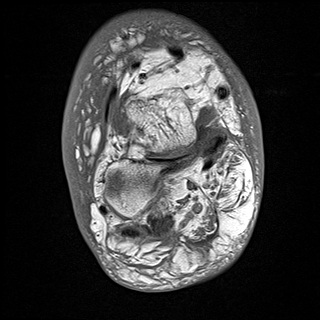
[im 60/60]
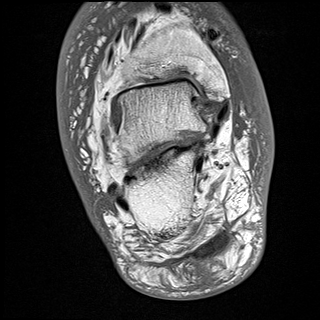

[Series 4: T2 · coronal · right · 3.0mm · 0.58mm/px · 12 of 59 slices shown (1 of 2)]
[im 1/59]
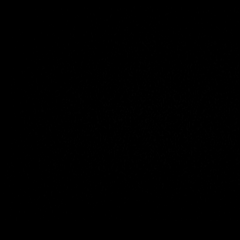
[im 6/59]
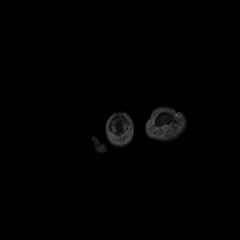
[im 11/59]
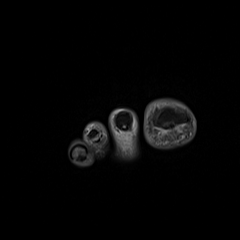
[im 16/59]
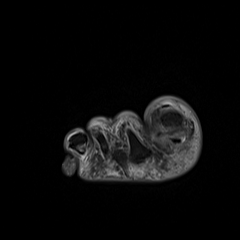
[im 22/59]
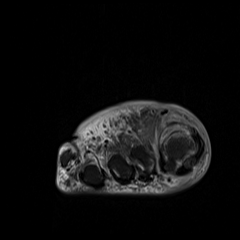
[im 27/59]
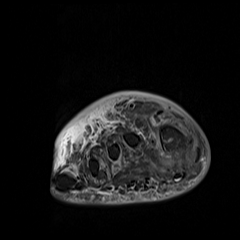
[im 32/59]
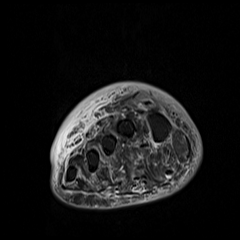
[im 37/59]
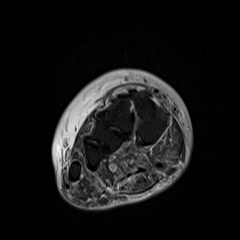
[im 43/59]
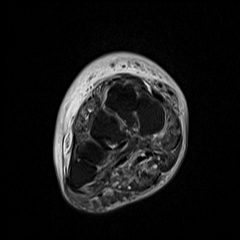
[im 48/59]
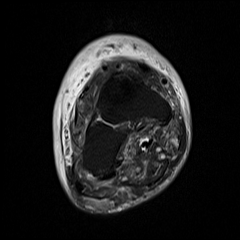
[im 53/59]
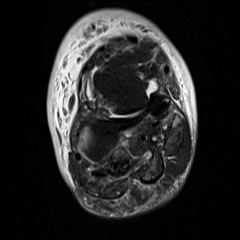
[im 59/59]
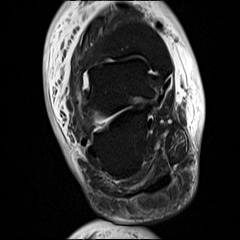

[Series 6: T1 · oblique · right · 3.0mm · 0.78mm/px · 5 of 27 slices shown (2 of 2)]
[im 1/27]
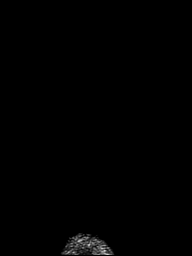
[im 7/27]
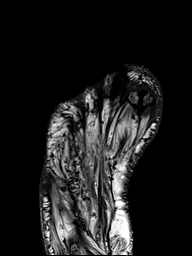
[im 14/27]
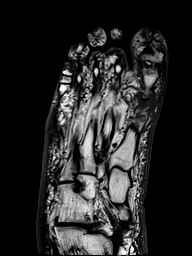
[im 20/27]
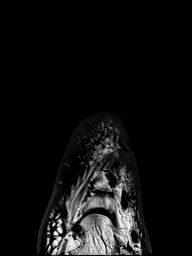
[im 27/27]
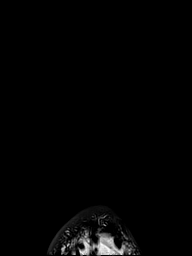

[Series 8: T2 · oblique · right · 3.0mm · 0.78mm/px · 5 of 27 slices shown (2 of 2)]
[im 1/27]
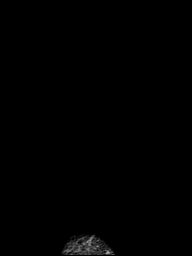
[im 7/27]
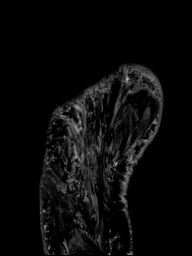
[im 14/27]
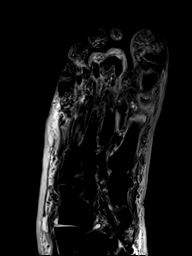
[im 20/27]
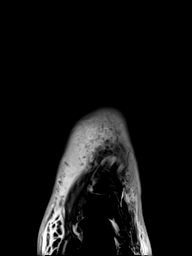
[im 27/27]
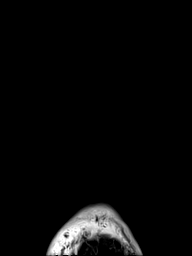

[Series 9: STIR · sagittal · right · 3.0mm · 0.66mm/px · 6 of 29 slices shown]
[im 1/29]
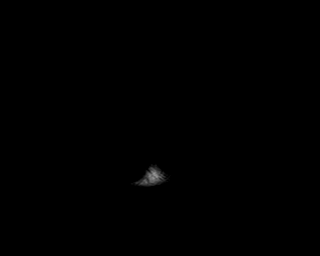
[im 6/29]
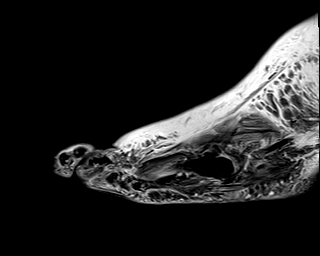
[im 12/29]
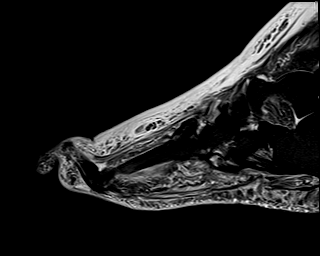
[im 17/29]
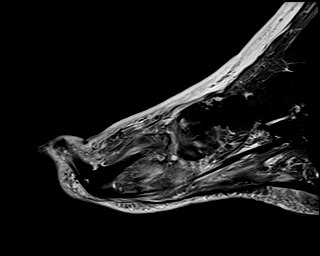
[im 23/29]
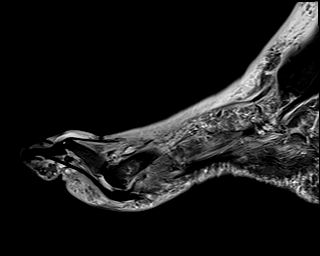
[im 29/29]
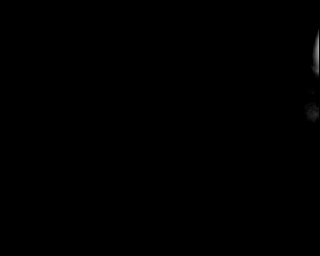

[40 of 40 positions shown; findings below may reference images not displayed]

FINDINGS: Bones/Joint/Cartilage

There is abnormal T2 signal within the distal aspect of the first
metatarsal, with cortical irregularity along the plantar aspect of
the metatarsal head. Findings are suspicious for osteomyelitis.

No other acute or destructive bony lesions.

There is prominent joint space narrowing of the first
metatarsophalangeal joint, with moderate joint effusion identified.
Hammertoe deformities are noted.

Ligaments

No acute findings.

Muscles and Tendons

No acute findings.

Soft tissues

There is diffuse subcutaneous edema throughout the right foot and
visualized portions the ankle. No fluid collection or evidence of
abscess.
IMPRESSION: 1. Cortical irregularity plantar aspect distal margin first
metatarsal, with underlying abnormal T2 signal. Given clinical
history, favor osteomyelitis over occult fracture.
2. Moderate joint effusion of the first metatarsophalangeal joint.
3. Diffuse subcutaneous edema.

## 2023-09-09 IMAGING — CR DG FOOT COMPLETE 3+V*R*
3 series · 3 of 3 positions shown · non-contrast
Comparison: Right foot radiographs same date at 1116 hours from
[REDACTED] in [HOSPITAL].

CLINICAL DATA: Right foot pain with swelling and redness. Question
infection.

EXAM:
RIGHT FOOT COMPLETE - 3+ VIEW

[foot ap]
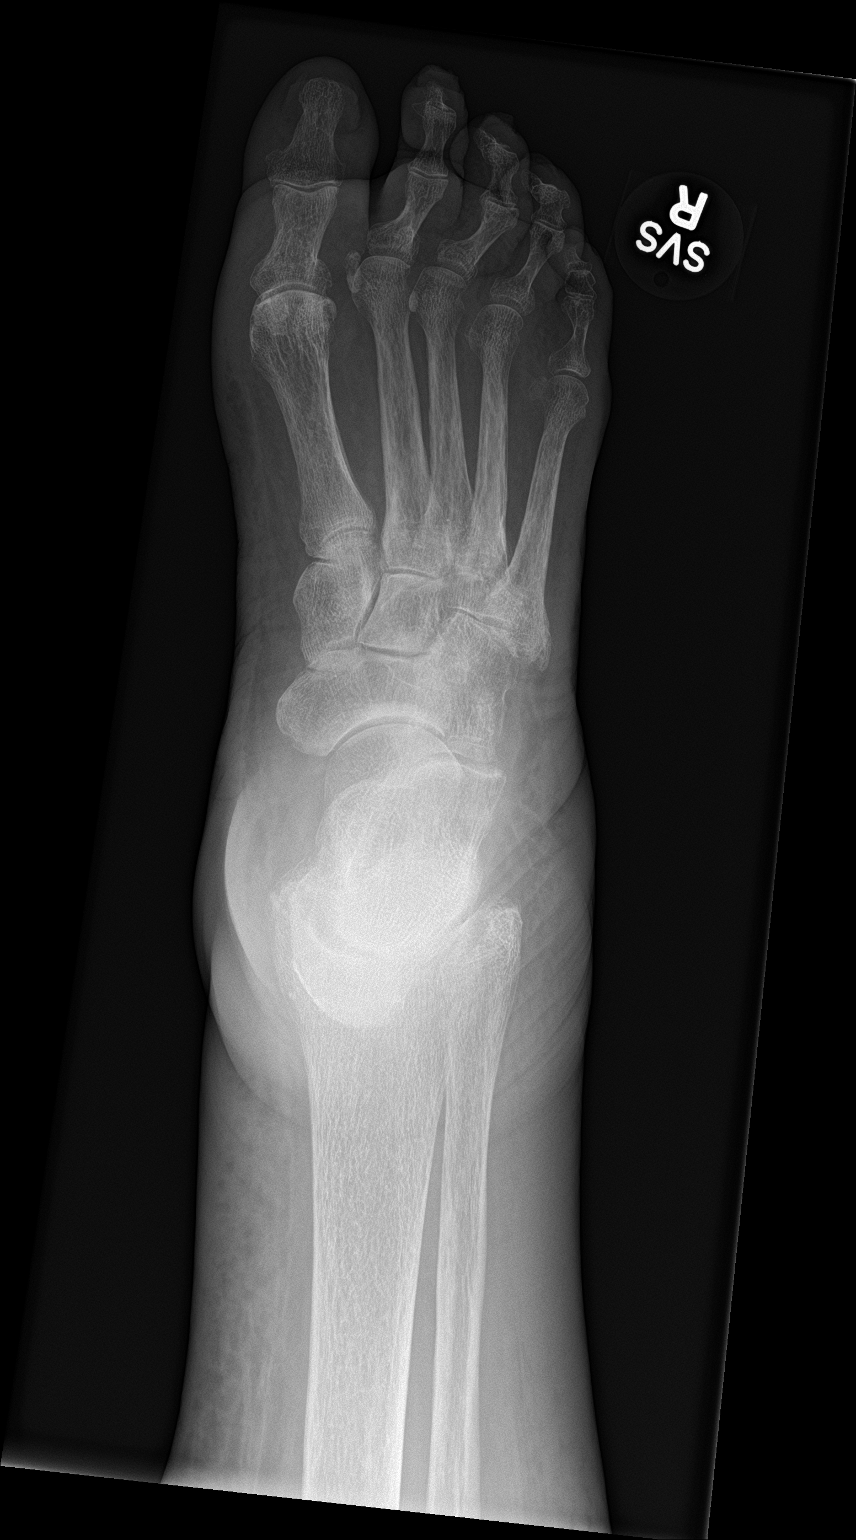

[foot obl]
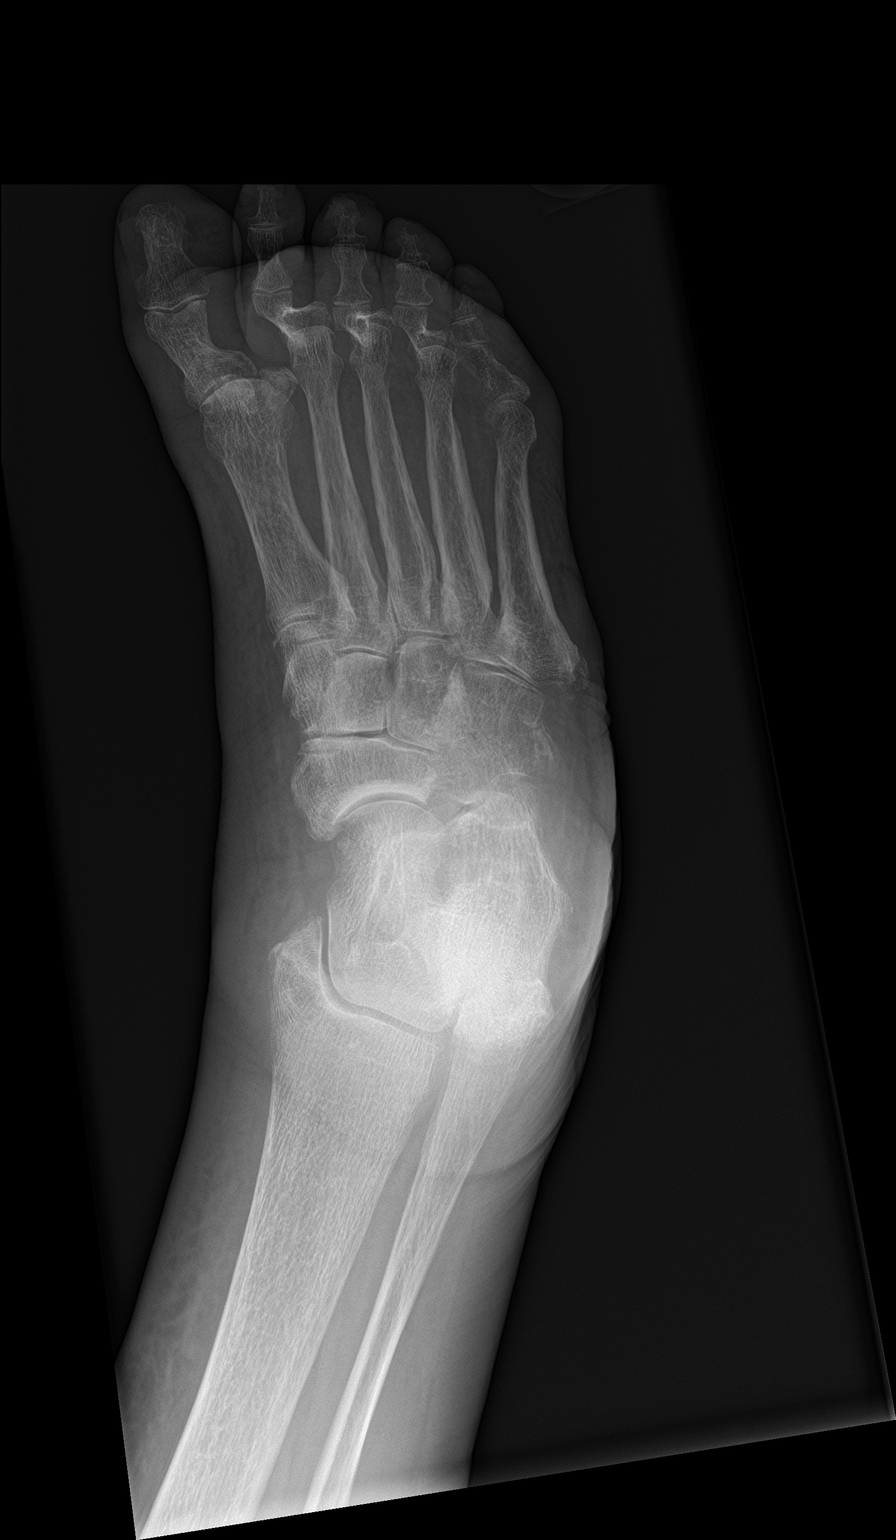

[foot lat]
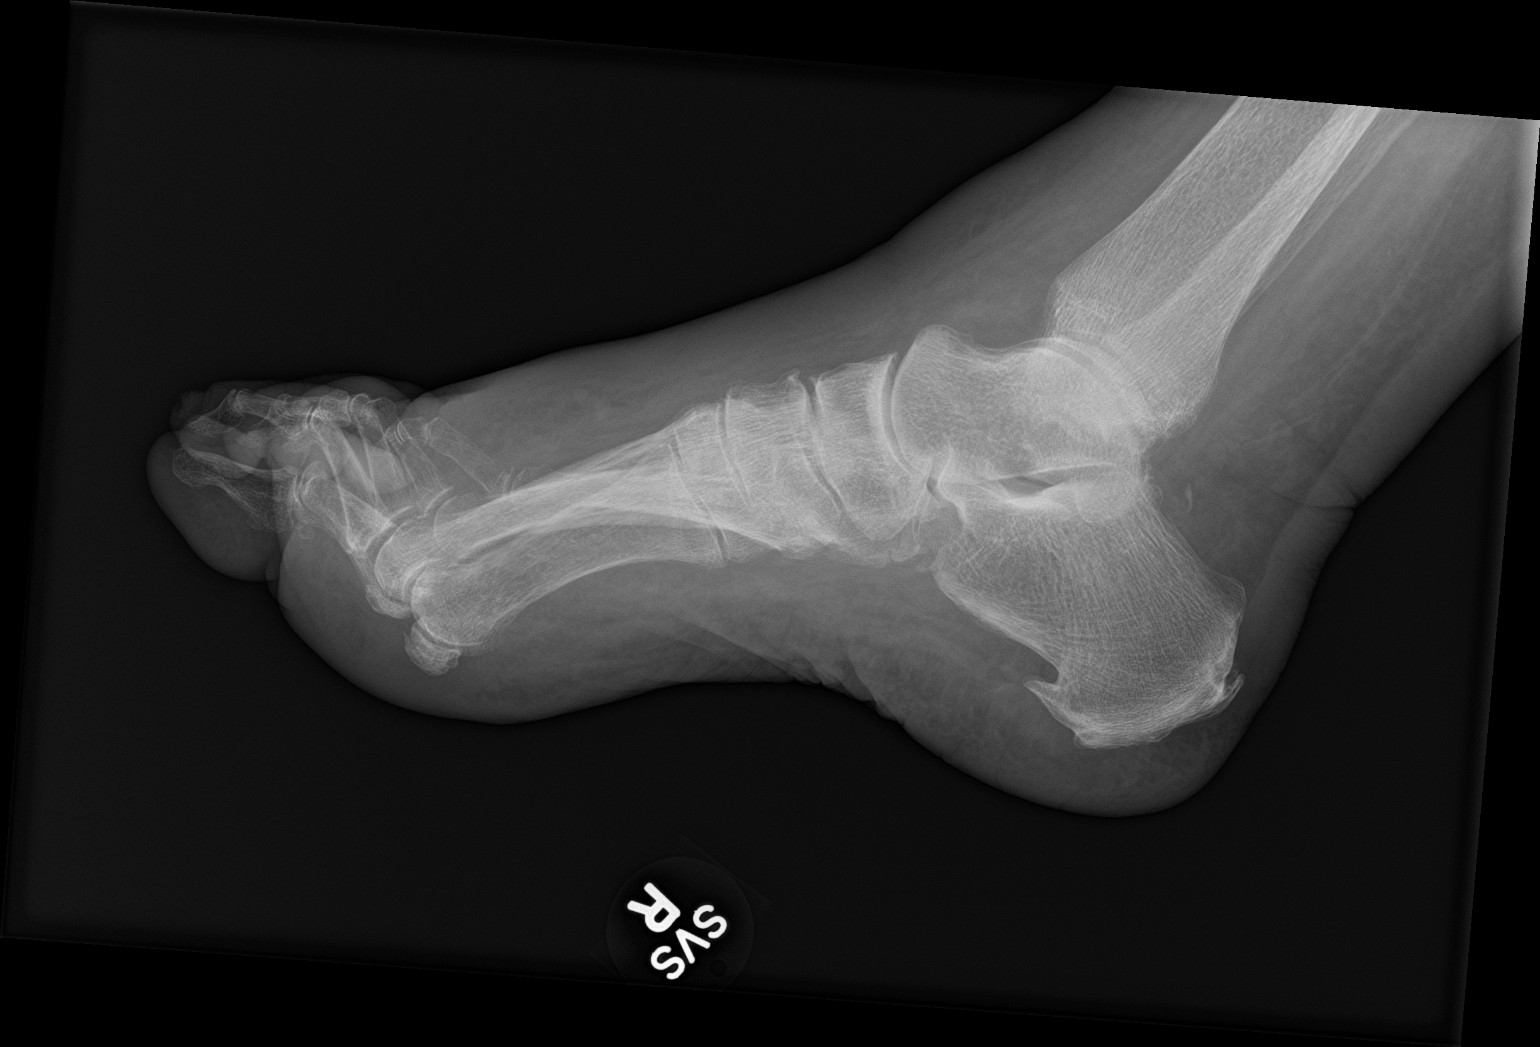

[3 of 3 positions shown; findings below may reference images not displayed]

FINDINGS: 9146 hours. The bones appear diffusely demineralized. There is no
evidence of acute fracture or dislocation. There is spurring of the
base of the 5th metatarsal and calcaneal tuberosity. No bone
destruction identified to suggest osteomyelitis. Diffuse soft tissue
swelling, especially dorsally. No evidence of foreign body or soft
tissue emphysema. Mild degenerative changes at the 1st MTP joint.
IMPRESSION: Nonspecific forefoot soft tissue swelling and osteopenia. No
radiographic evidence of osteomyelitis.

## 2023-09-09 IMAGING — DX DG CHEST 1V PORT
1 series · 1 of 1 positions shown · non-contrast
Comparison: None.

CLINICAL DATA: Foot infection.  Questionable sepsis.

EXAM:
PORTABLE CHEST 1 VIEW

[chest ap]
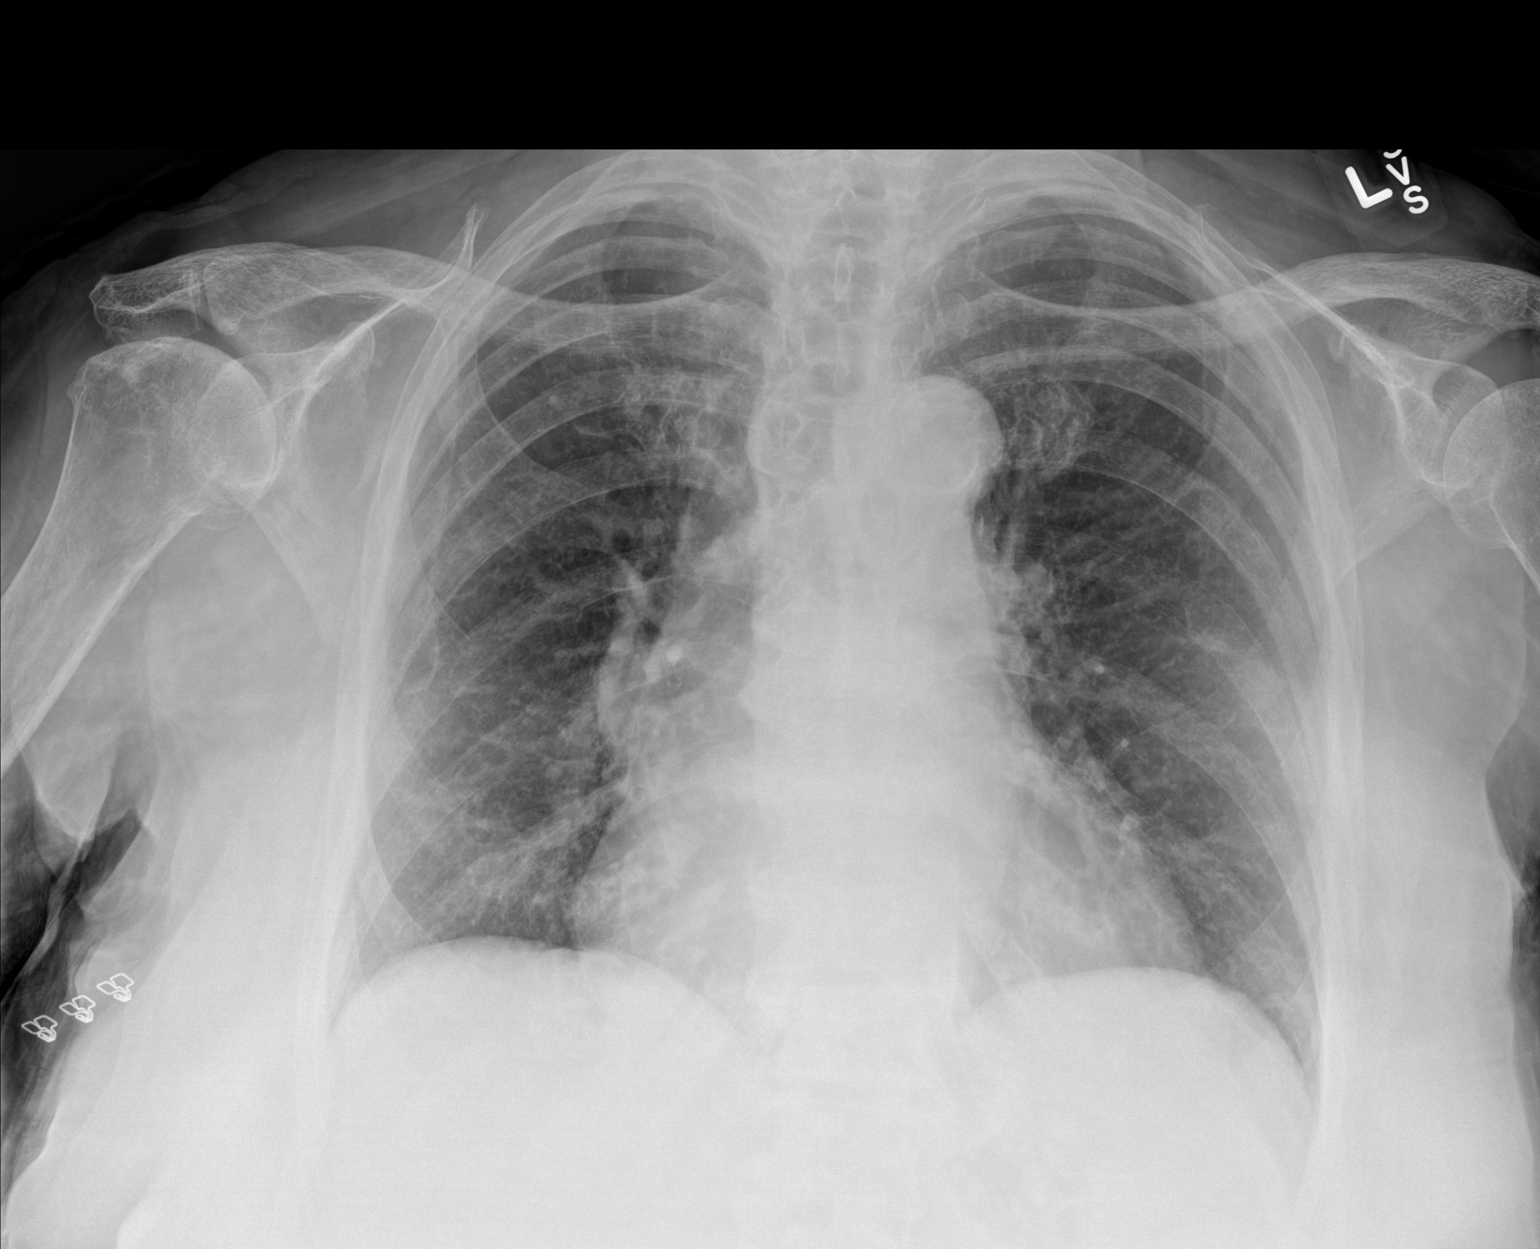

[1 of 1 positions shown; findings below may reference images not displayed]

FINDINGS: Heart is enlarged. Atherosclerotic changes are present at the aortic
arch. Lung volumes are low. Mild interstitial coarsening appears
chronic. No focal airspace disease present. Degenerative changes are
present at the shoulders.
IMPRESSION: 1. Cardiomegaly without failure.
2. Low lung volumes.
3. No acute cardiopulmonary disease.

## 2023-09-10 IMAGING — US US EXTREM LOW VENOUS
1 series · 14 of 24 positions shown · non-contrast
Comparison: None.

CLINICAL DATA: Pain and swelling

EXAM:
BILATERAL LOWER EXTREMITY VENOUS DOPPLER ULTRASOUND
TECHNIQUE: Gray-scale sonography with compression, as well as color and duplex
ultrasound, were performed to evaluate the deep venous system(s)
from the level of the common femoral vein through the popliteal and
proximal calf veins.

[Series 1: us venous img lower bilat (dvt) · portal-venous · 14 of 61 slices shown]
[im 1/61]
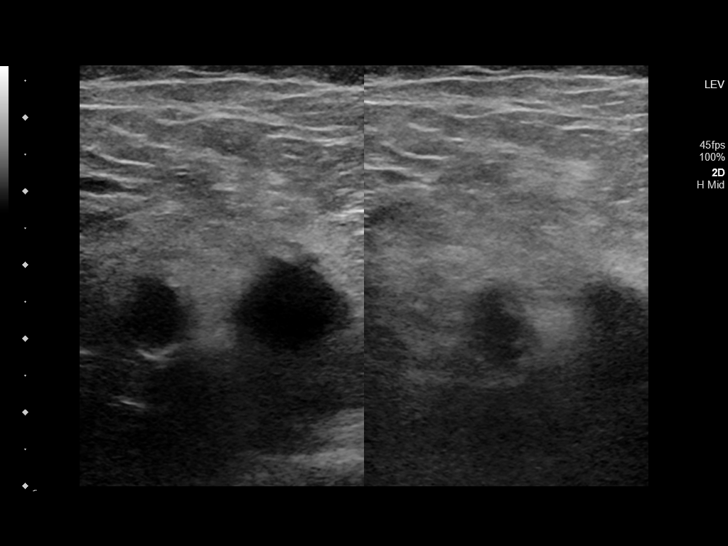
[im 6/61]
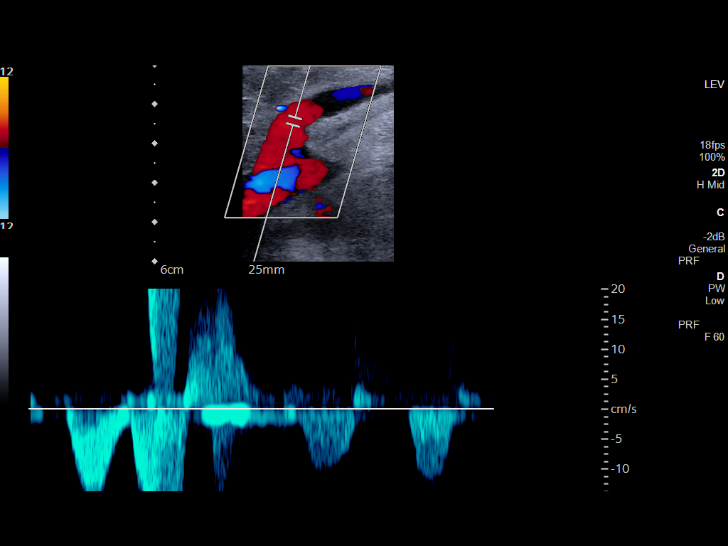
[im 11/61]
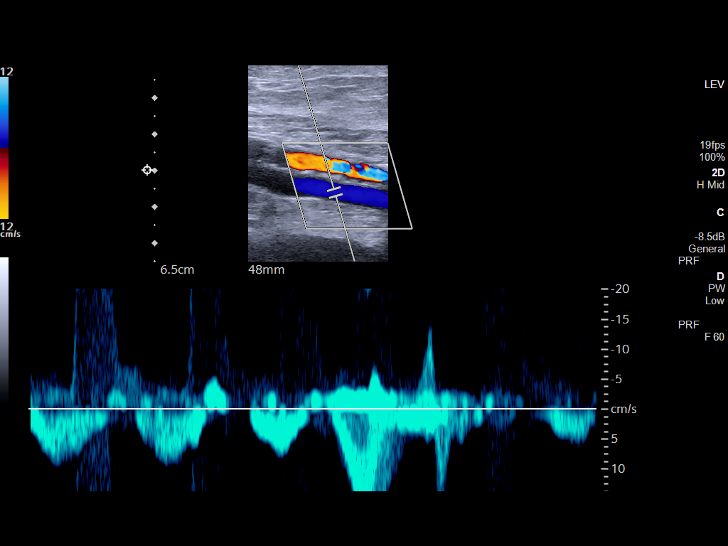
[im 16/61]
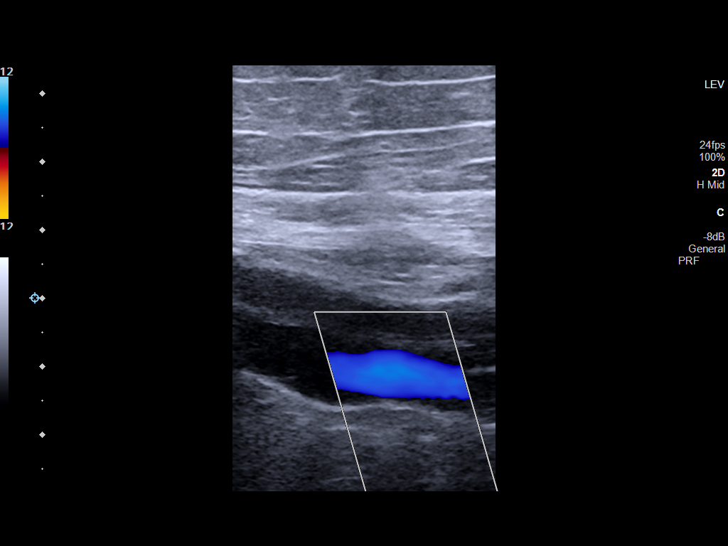
[im 19/61]
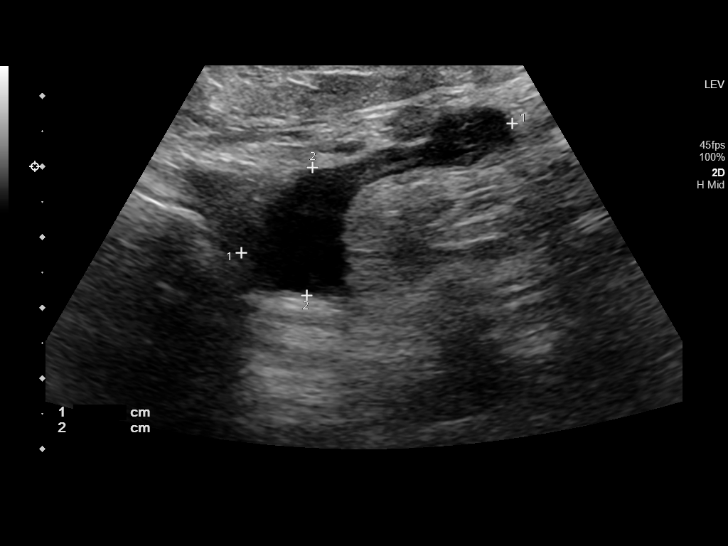
[im 24/61]
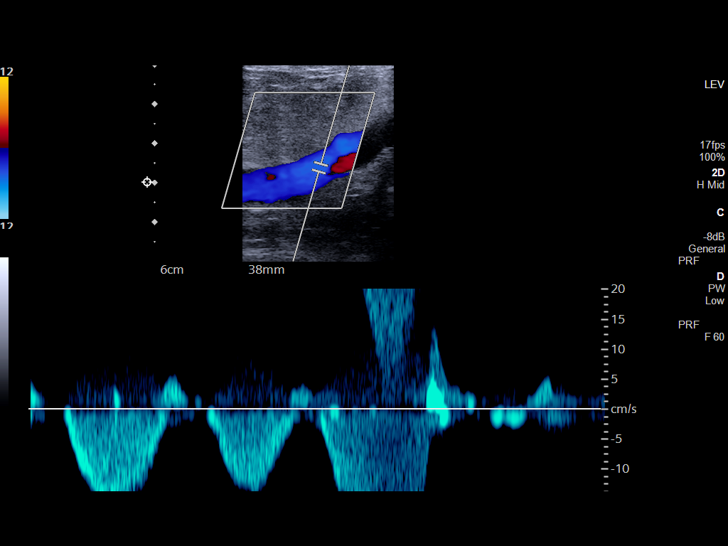
[im 29/61]
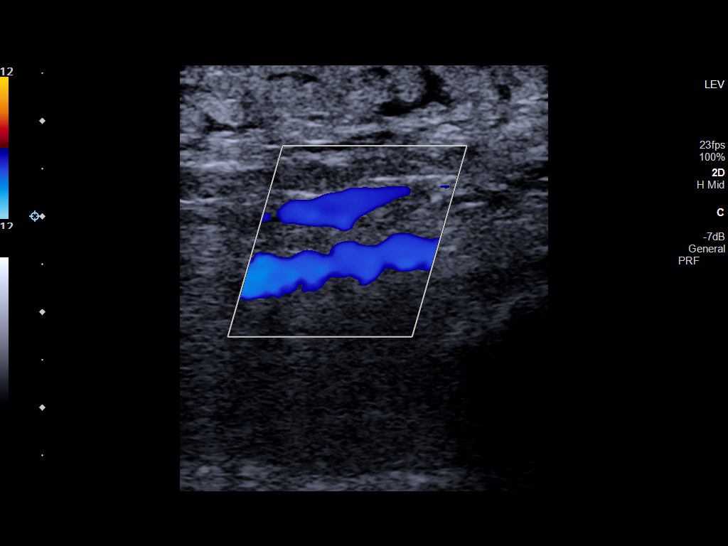
[im 32/61]
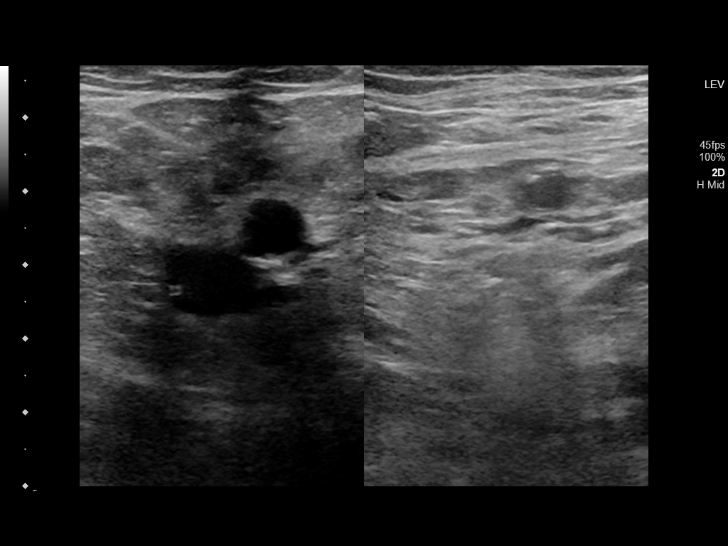
[im 37/61]
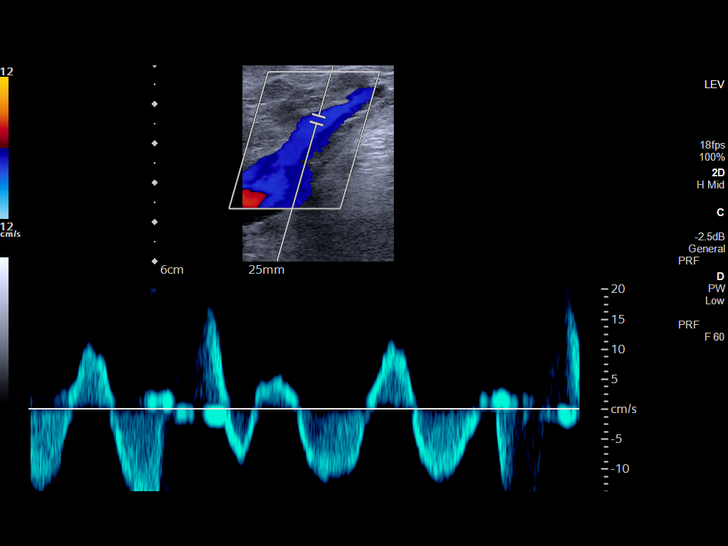
[im 42/61]
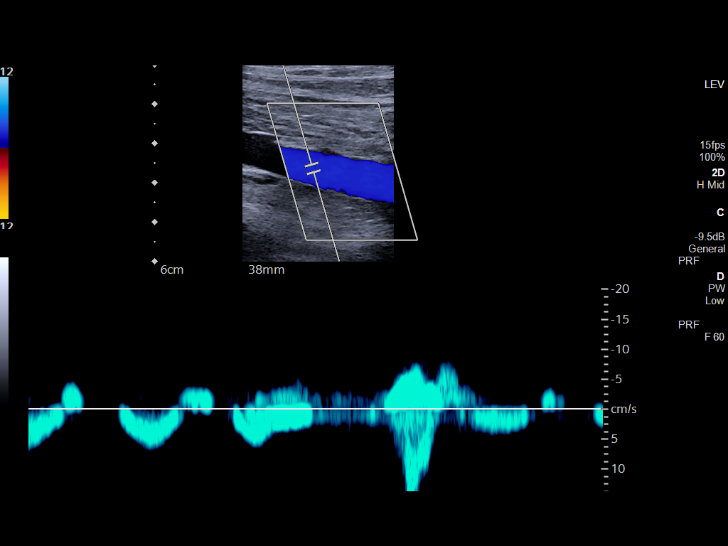
[im 47/61]
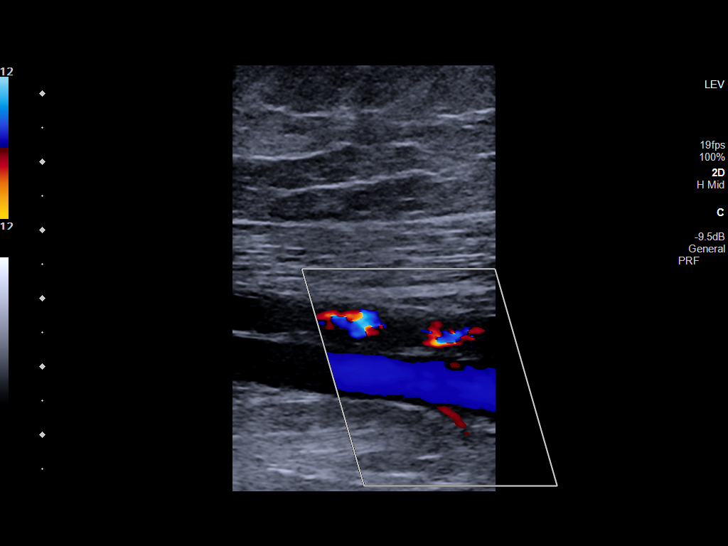
[im 50/61]
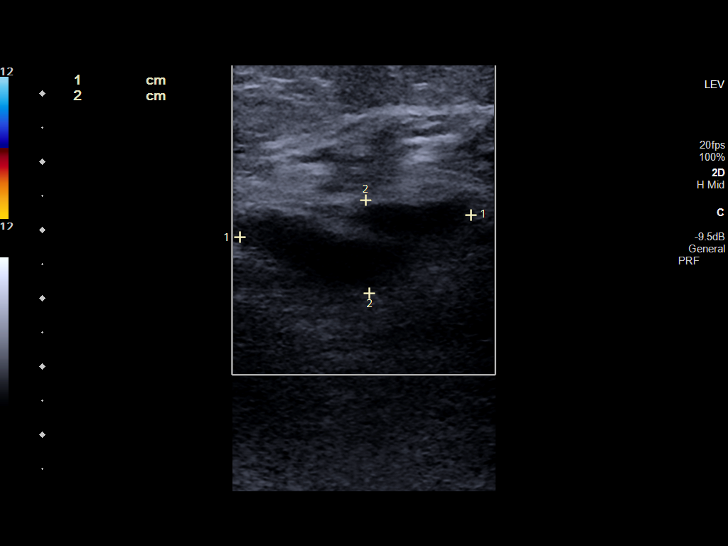
[im 55/61]
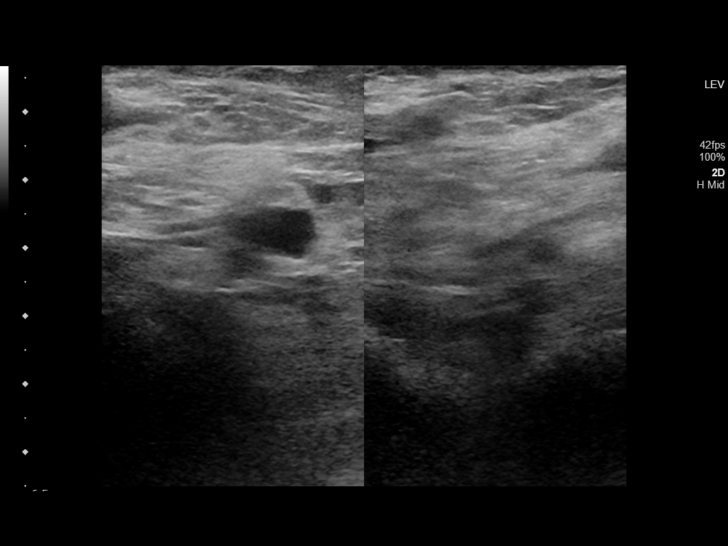
[im 61/61]
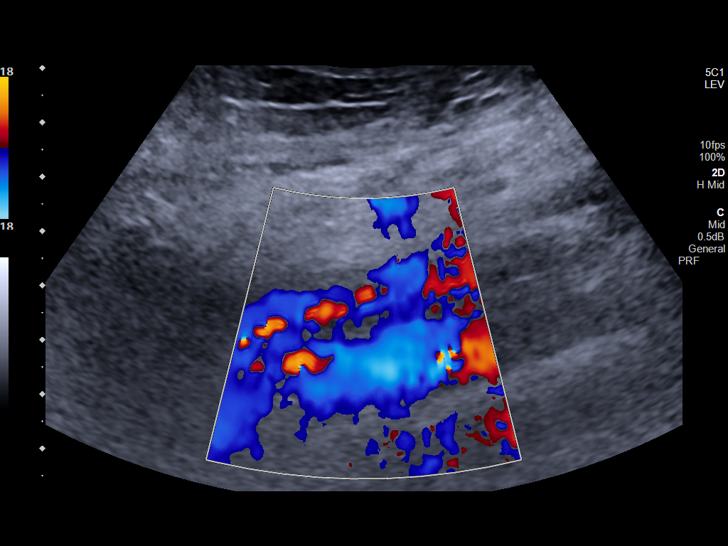

[14 of 24 positions shown; findings below may reference images not displayed]

FINDINGS: VENOUS

Normal compressibility of the common femoral, superficial femoral,
and popliteal veins, as well as the visualized calf veins.
Visualized portions of profunda femoral vein and great saphenous
vein unremarkable. No filling defects to suggest DVT on grayscale or
color Doppler imaging. Doppler waveforms show normal direction of
venous flow, normal respiratory plasticity and response to
augmentation.

OTHER

4.3 x 1.8 x 5.0 cm right popliteal fluid collection is most likely a
Baker's cyst.

6.0 x 3.4 x 1.4 cm fluid collection in the left popliteal fossa also
favored to be a Baker cyst.

Limitations: none
IMPRESSION: Bilateral lower extremity swelling.
